# Patient Record
Sex: Female | Born: 1990 | Hispanic: Yes | Marital: Single | Smoking: Never smoker
Health system: Southern US, Community
[De-identification: ages and names within clinical notes are randomized; demographics above are authoritative.]

## PROBLEM LIST (undated history)

## (undated) DIAGNOSIS — K219 Gastro-esophageal reflux disease without esophagitis: Secondary | ICD-10-CM

## (undated) DIAGNOSIS — A64 Unspecified sexually transmitted disease: Secondary | ICD-10-CM

## (undated) DIAGNOSIS — G43909 Migraine, unspecified, not intractable, without status migrainosus: Secondary | ICD-10-CM

## (undated) DIAGNOSIS — R519 Headache, unspecified: Secondary | ICD-10-CM

## (undated) DIAGNOSIS — E785 Hyperlipidemia, unspecified: Secondary | ICD-10-CM

## (undated) DIAGNOSIS — R51 Headache: Secondary | ICD-10-CM

## (undated) HISTORY — DX: Hyperlipidemia, unspecified: E78.5

## (undated) HISTORY — DX: Headache: R51

## (undated) HISTORY — DX: Unspecified sexually transmitted disease: A64

## (undated) HISTORY — DX: Gastro-esophageal reflux disease without esophagitis: K21.9

## (undated) HISTORY — DX: Migraine, unspecified, not intractable, without status migrainosus: G43.909

## (undated) HISTORY — DX: Headache, unspecified: R51.9

## (undated) HISTORY — PX: NO PAST SURGERIES: SHX2092

---
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
## 2011-07-09 LAB — HM PAP SMEAR

## 2013-01-13 ENCOUNTER — Encounter: Payer: Self-pay | Admitting: Family Medicine

## 2013-01-13 ENCOUNTER — Ambulatory Visit (INDEPENDENT_AMBULATORY_CARE_PROVIDER_SITE_OTHER): Payer: PRIVATE HEALTH INSURANCE | Admitting: Family Medicine

## 2013-01-13 VITALS — BP 100/62 | HR 93 | Temp 98.6°F | Ht 59.0 in | Wt 93.4 lb

## 2013-01-13 DIAGNOSIS — IMO0001 Reserved for inherently not codable concepts without codable children: Secondary | ICD-10-CM

## 2013-01-13 DIAGNOSIS — Z23 Encounter for immunization: Secondary | ICD-10-CM

## 2013-01-13 DIAGNOSIS — Z0000 Encounter for general adult medical examination without abnormal findings: Secondary | ICD-10-CM

## 2013-01-13 DIAGNOSIS — Z309 Encounter for contraceptive management, unspecified: Secondary | ICD-10-CM

## 2013-01-13 NOTE — Progress Notes (Signed)
Subjective:     Tara Chang is a 22 y.o. female and is here for a comprehensive physical exam. The patient reports problems - dent in L thigh--- no accident , woke up with it one day.  History   Social History  . Marital Status: Single    Spouse Name: N/A    Number of Children: N/A  . Years of Education: N/A   Occupational History  . Not on file.   Social History Main Topics  . Smoking status: Never Smoker   . Smokeless tobacco: Never Used  . Alcohol Use: Yes     Comment: Socially  . Drug Use: No  . Sexually Active: Yes -- Female partner(s)   Other Topics Concern  . Not on file   Social History Narrative   Exercise--  2-3 days a week   No health maintenance topics applied.  The following portions of the patient's history were reviewed and updated as appropriate:  She  has a past medical history of Hyperlipemia and Migraines. She  does not have a problem list on file. She  has no past surgical history on file. Her family history includes Thyroid disease in her mother. She  reports that she has never smoked. She has never used smokeless tobacco. She reports that  drinks alcohol. She reports that she does not use illicit drugs. She currently has no medications in their medication list. No current outpatient prescriptions on file prior to visit.   No current facility-administered medications on file prior to visit.   She has No Known Allergies..  Review of Systems Review of Systems  Constitutional: Negative for activity change, appetite change and fatigue.  HENT: Negative for hearing loss, congestion, tinnitus and ear discharge.  dentist q7m Eyes: Negative for visual disturbance (see optho-- no) Respiratory: Negative for cough, chest tightness and shortness of breath.   Cardiovascular: Negative for chest pain, palpitations and leg swelling.  Gastrointestinal: Negative for abdominal pain, diarrhea, constipation and abdominal distention.  Genitourinary: Negative  for urgency, frequency, decreased urine volume and difficulty urinating.  Musculoskeletal: Negative for back pain, arthralgias and gait problem.  Skin: Negative for color change, pallor and rash.  Neurological: Negative for dizziness, light-headedness, numbness and headaches.  Hematological: Negative for adenopathy. Does not bruise/bleed easily.  Psychiatric/Behavioral: Negative for suicidal ideas, confusion, sleep disturbance, self-injury, dysphoric mood, decreased concentration and agitation.       Objective:    BP 100/62  Pulse 93  Temp(Src) 98.6 F (37 C) (Oral)  Ht 4\' 11"  (1.499 m)  Wt 93 lb 6.4 oz (42.366 kg)  BMI 18.85 kg/m2  SpO2 98% General appearance: alert, cooperative, appears stated age and no distress Head: Normocephalic, without obvious abnormality, atraumatic Eyes: conjunctivae/corneas clear. PERRL, EOM's intact. Fundi benign. Ears: normal TM's and external ear canals both ears Nose: Nares normal. Septum midline. Mucosa normal. No drainage or sinus tenderness. Throat: lips, mucosa, and tongue normal; teeth and gums normal Neck: no adenopathy, no carotid bruit, no JVD, supple, symmetrical, trachea midline and thyroid not enlarged, symmetric, no tenderness/mass/nodules Back: symmetric, no curvature. ROM normal. No CVA tenderness. Lungs: clear to auscultation bilaterally Breasts: gyn Heart: regular rate and rhythm, S1, S2 normal, no murmur, click, rub or gallop Abdomen: soft, non-tender; bowel sounds normal; no masses,  no organomegaly Pelvic: deferred--gyn Extremities: extremities normal, atraumatic, no cyanosis or edema Pulses: 2+ and symmetric Skin: Skin color, texture, turgor normal. No rashes or lesions Lymph nodes: Cervical, supraclavicular, and axillary nodes normal. Neurologic: Alert and  oriented X 3, normal strength and tone. Normal symmetric reflexes. Normal coordination and gait Psych- no anxiety, no depression      Assessment:    Healthy female  exam.      Plan:    ghm utd Check labs Refer to gyn at pt request-- for contraception.  Pt unable to take OCP and depo caused weight loss See After Visit Summary for Counseling Recommendations

## 2013-01-13 NOTE — Patient Instructions (Signed)
Preventive Care for Adults, Female A healthy lifestyle and preventive care can promote health and wellness. Preventive health guidelines for women include the following key practices.  A routine yearly physical is a good way to check with your caregiver about your health and preventive screening. It is a chance to share any concerns and updates on your health, and to receive a thorough exam.  Visit your dentist for a routine exam and preventive care every 6 months. Brush your teeth twice a day and floss once a day. Good oral hygiene prevents tooth decay and gum disease.  The frequency of eye exams is based on your age, health, family medical history, use of contact lenses, and other factors. Follow your caregiver's recommendations for frequency of eye exams.  Eat a healthy diet. Foods like vegetables, fruits, whole grains, low-fat dairy products, and lean protein foods contain the nutrients you need without too many calories. Decrease your intake of foods high in solid fats, added sugars, and salt. Eat the right amount of calories for you.Get information about a proper diet from your caregiver, if necessary.  Regular physical exercise is one of the most important things you can do for your health. Most adults should get at least 150 minutes of moderate-intensity exercise (any activity that increases your heart rate and causes you to sweat) each week. In addition, most adults need muscle-strengthening exercises on 2 or more days a week.  Maintain a healthy weight. The body mass index (BMI) is a screening tool to identify possible weight problems. It provides an estimate of body fat based on height and weight. Your caregiver can help determine your BMI, and can help you achieve or maintain a healthy weight.For adults 20 years and older:  A BMI below 18.5 is considered underweight.  A BMI of 18.5 to 24.9 is normal.  A BMI of 25 to 29.9 is considered overweight.  A BMI of 30 and above is  considered obese.  Maintain normal blood lipids and cholesterol levels by exercising and minimizing your intake of saturated fat. Eat a balanced diet with plenty of fruit and vegetables. Blood tests for lipids and cholesterol should begin at age 20 and be repeated every 5 years. If your lipid or cholesterol levels are high, you are over 50, or you are at high risk for heart disease, you may need your cholesterol levels checked more frequently.Ongoing high lipid and cholesterol levels should be treated with medicines if diet and exercise are not effective.  If you smoke, find out from your caregiver how to quit. If you do not use tobacco, do not start.  If you are pregnant, do not drink alcohol. If you are breastfeeding, be very cautious about drinking alcohol. If you are not pregnant and choose to drink alcohol, do not exceed 1 drink per day. One drink is considered to be 12 ounces (355 mL) of beer, 5 ounces (148 mL) of wine, or 1.5 ounces (44 mL) of liquor.  Avoid use of street drugs. Do not share needles with anyone. Ask for help if you need support or instructions about stopping the use of drugs.  High blood pressure causes heart disease and increases the risk of stroke. Your blood pressure should be checked at least every 1 to 2 years. Ongoing high blood pressure should be treated with medicines if weight loss and exercise are not effective.  If you are 55 to 21 years old, ask your caregiver if you should take aspirin to prevent strokes.  Diabetes   screening involves taking a blood sample to check your fasting blood sugar level. This should be done once every 3 years, after age 45, if you are within normal weight and without risk factors for diabetes. Testing should be considered at a younger age or be carried out more frequently if you are overweight and have at least 1 risk factor for diabetes.  Breast cancer screening is essential preventive care for women. You should practice "breast  self-awareness." This means understanding the normal appearance and feel of your breasts and may include breast self-examination. Any changes detected, no matter how small, should be reported to a caregiver. Women in their 20s and 30s should have a clinical breast exam (CBE) by a caregiver as part of a regular health exam every 1 to 3 years. After age 40, women should have a CBE every year. Starting at age 40, women should consider having a mammography (breast X-ray test) every year. Women who have a family history of breast cancer should talk to their caregiver about genetic screening. Women at a high risk of breast cancer should talk to their caregivers about having magnetic resonance imaging (MRI) and a mammography every year.  The Pap test is a screening test for cervical cancer. A Pap test can show cell changes on the cervix that might become cervical cancer if left untreated. A Pap test is a procedure in which cells are obtained and examined from the lower end of the uterus (cervix).  Women should have a Pap test starting at age 21.  Between ages 21 and 29, Pap tests should be repeated every 2 years.  Beginning at age 30, you should have a Pap test every 3 years as long as the past 3 Pap tests have been normal.  Some women have medical problems that increase the chance of getting cervical cancer. Talk to your caregiver about these problems. It is especially important to talk to your caregiver if a new problem develops soon after your last Pap test. In these cases, your caregiver may recommend more frequent screening and Pap tests.  The above recommendations are the same for women who have or have not gotten the vaccine for human papillomavirus (HPV).  If you had a hysterectomy for a problem that was not cancer or a condition that could lead to cancer, then you no longer need Pap tests. Even if you no longer need a Pap test, a regular exam is a good idea to make sure no other problems are  starting.  If you are between ages 65 and 70, and you have had normal Pap tests going back 10 years, you no longer need Pap tests. Even if you no longer need a Pap test, a regular exam is a good idea to make sure no other problems are starting.  If you have had past treatment for cervical cancer or a condition that could lead to cancer, you need Pap tests and screening for cancer for at least 20 years after your treatment.  If Pap tests have been discontinued, risk factors (such as a new sexual partner) need to be reassessed to determine if screening should be resumed.  The HPV test is an additional test that may be used for cervical cancer screening. The HPV test looks for the virus that can cause the cell changes on the cervix. The cells collected during the Pap test can be tested for HPV. The HPV test could be used to screen women aged 30 years and older, and should   be used in women of any age who have unclear Pap test results. After the age of 30, women should have HPV testing at the same frequency as a Pap test.  Colorectal cancer can be detected and often prevented. Most routine colorectal cancer screening begins at the age of 50 and continues through age 75. However, your caregiver may recommend screening at an earlier age if you have risk factors for colon cancer. On a yearly basis, your caregiver may provide home test kits to check for hidden blood in the stool. Use of a small camera at the end of a tube, to directly examine the colon (sigmoidoscopy or colonoscopy), can detect the earliest forms of colorectal cancer. Talk to your caregiver about this at age 50, when routine screening begins. Direct examination of the colon should be repeated every 5 to 10 years through age 75, unless early forms of pre-cancerous polyps or small growths are found.  Hepatitis C blood testing is recommended for all people born from 1945 through 1965 and any individual with known risks for hepatitis C.  Practice  safe sex. Use condoms and avoid high-risk sexual practices to reduce the spread of sexually transmitted infections (STIs). STIs include gonorrhea, chlamydia, syphilis, trichomonas, herpes, HPV, and human immunodeficiency virus (HIV). Herpes, HIV, and HPV are viral illnesses that have no cure. They can result in disability, cancer, and death. Sexually active women aged 25 and younger should be checked for chlamydia. Older women with new or multiple partners should also be tested for chlamydia. Testing for other STIs is recommended if you are sexually active and at increased risk.  Osteoporosis is a disease in which the bones lose minerals and strength with aging. This can result in serious bone fractures. The risk of osteoporosis can be identified using a bone density scan. Women ages 65 and over and women at risk for fractures or osteoporosis should discuss screening with their caregivers. Ask your caregiver whether you should take a calcium supplement or vitamin D to reduce the rate of osteoporosis.  Menopause can be associated with physical symptoms and risks. Hormone replacement therapy is available to decrease symptoms and risks. You should talk to your caregiver about whether hormone replacement therapy is right for you.  Use sunscreen with sun protection factor (SPF) of 30 or more. Apply sunscreen liberally and repeatedly throughout the day. You should seek shade when your shadow is shorter than you. Protect yourself by wearing long sleeves, pants, a wide-brimmed hat, and sunglasses year round, whenever you are outdoors.  Once a month, do a whole body skin exam, using a mirror to look at the skin on your back. Notify your caregiver of new moles, moles that have irregular borders, moles that are larger than a pencil eraser, or moles that have changed in shape or color.  Stay current with required immunizations.  Influenza. You need a dose every fall (or winter). The composition of the flu vaccine  changes each year, so being vaccinated once is not enough.  Pneumococcal polysaccharide. You need 1 to 2 doses if you smoke cigarettes or if you have certain chronic medical conditions. You need 1 dose at age 65 (or older) if you have never been vaccinated.  Tetanus, diphtheria, pertussis (Tdap, Td). Get 1 dose of Tdap vaccine if you are younger than age 65, are over 65 and have contact with an infant, are a healthcare worker, are pregnant, or simply want to be protected from whooping cough. After that, you need a Td   booster dose every 10 years. Consult your caregiver if you have not had at least 3 tetanus and diphtheria-containing shots sometime in your life or have a deep or dirty wound.  HPV. You need this vaccine if you are a woman age 26 or younger. The vaccine is given in 3 doses over 6 months.  Measles, mumps, rubella (MMR). You need at least 1 dose of MMR if you were born in 1957 or later. You may also need a second dose.  Meningococcal. If you are age 19 to 21 and a first-year college student living in a residence hall, or have one of several medical conditions, you need to get vaccinated against meningococcal disease. You may also need additional booster doses.  Zoster (shingles). If you are age 60 or older, you should get this vaccine.  Varicella (chickenpox). If you have never had chickenpox or you were vaccinated but received only 1 dose, talk to your caregiver to find out if you need this vaccine.  Hepatitis A. You need this vaccine if you have a specific risk factor for hepatitis A virus infection or you simply wish to be protected from this disease. The vaccine is usually given as 2 doses, 6 to 18 months apart.  Hepatitis B. You need this vaccine if you have a specific risk factor for hepatitis B virus infection or you simply wish to be protected from this disease. The vaccine is given in 3 doses, usually over 6 months. Preventive Services / Frequency Ages 19 to 39  Blood  pressure check.** / Every 1 to 2 years.  Lipid and cholesterol check.** / Every 5 years beginning at age 20.  Clinical breast exam.** / Every 3 years for women in their 20s and 30s.  Pap test.** / Every 2 years from ages 21 through 29. Every 3 years starting at age 30 through age 65 or 70 with a history of 3 consecutive normal Pap tests.  HPV screening.** / Every 3 years from ages 30 through ages 65 to 70 with a history of 3 consecutive normal Pap tests.  Hepatitis C blood test.** / For any individual with known risks for hepatitis C.  Skin self-exam. / Monthly.  Influenza immunization.** / Every year.  Pneumococcal polysaccharide immunization.** / 1 to 2 doses if you smoke cigarettes or if you have certain chronic medical conditions.  Tetanus, diphtheria, pertussis (Tdap, Td) immunization. / A one-time dose of Tdap vaccine. After that, you need a Td booster dose every 10 years.  HPV immunization. / 3 doses over 6 months, if you are 26 and younger.  Measles, mumps, rubella (MMR) immunization. / You need at least 1 dose of MMR if you were born in 1957 or later. You may also need a second dose.  Meningococcal immunization. / 1 dose if you are age 19 to 21 and a first-year college student living in a residence hall, or have one of several medical conditions, you need to get vaccinated against meningococcal disease. You may also need additional booster doses.  Varicella immunization.** / Consult your caregiver.  Hepatitis A immunization.** / Consult your caregiver. 2 doses, 6 to 18 months apart.  Hepatitis B immunization.** / Consult your caregiver. 3 doses usually over 6 months. Ages 40 to 64  Blood pressure check.** / Every 1 to 2 years.  Lipid and cholesterol check.** / Every 5 years beginning at age 20.  Clinical breast exam.** / Every year after age 40.  Mammogram.** / Every year beginning at age 40   and continuing for as long as you are in good health. Consult with your  caregiver.  Pap test.** / Every 3 years starting at age 30 through age 65 or 70 with a history of 3 consecutive normal Pap tests.  HPV screening.** / Every 3 years from ages 30 through ages 65 to 70 with a history of 3 consecutive normal Pap tests.  Fecal occult blood test (FOBT) of stool. / Every year beginning at age 50 and continuing until age 75. You may not need to do this test if you get a colonoscopy every 10 years.  Flexible sigmoidoscopy or colonoscopy.** / Every 5 years for a flexible sigmoidoscopy or every 10 years for a colonoscopy beginning at age 50 and continuing until age 75.  Hepatitis C blood test.** / For all people born from 1945 through 1965 and any individual with known risks for hepatitis C.  Skin self-exam. / Monthly.  Influenza immunization.** / Every year.  Pneumococcal polysaccharide immunization.** / 1 to 2 doses if you smoke cigarettes or if you have certain chronic medical conditions.  Tetanus, diphtheria, pertussis (Tdap, Td) immunization.** / A one-time dose of Tdap vaccine. After that, you need a Td booster dose every 10 years.  Measles, mumps, rubella (MMR) immunization. / You need at least 1 dose of MMR if you were born in 1957 or later. You may also need a second dose.  Varicella immunization.** / Consult your caregiver.  Meningococcal immunization.** / Consult your caregiver.  Hepatitis A immunization.** / Consult your caregiver. 2 doses, 6 to 18 months apart.  Hepatitis B immunization.** / Consult your caregiver. 3 doses, usually over 6 months. Ages 65 and over  Blood pressure check.** / Every 1 to 2 years.  Lipid and cholesterol check.** / Every 5 years beginning at age 20.  Clinical breast exam.** / Every year after age 40.  Mammogram.** / Every year beginning at age 40 and continuing for as long as you are in good health. Consult with your caregiver.  Pap test.** / Every 3 years starting at age 30 through age 65 or 70 with a 3  consecutive normal Pap tests. Testing can be stopped between 65 and 70 with 3 consecutive normal Pap tests and no abnormal Pap or HPV tests in the past 10 years.  HPV screening.** / Every 3 years from ages 30 through ages 65 or 70 with a history of 3 consecutive normal Pap tests. Testing can be stopped between 65 and 70 with 3 consecutive normal Pap tests and no abnormal Pap or HPV tests in the past 10 years.  Fecal occult blood test (FOBT) of stool. / Every year beginning at age 50 and continuing until age 75. You may not need to do this test if you get a colonoscopy every 10 years.  Flexible sigmoidoscopy or colonoscopy.** / Every 5 years for a flexible sigmoidoscopy or every 10 years for a colonoscopy beginning at age 50 and continuing until age 75.  Hepatitis C blood test.** / For all people born from 1945 through 1965 and any individual with known risks for hepatitis C.  Osteoporosis screening.** / A one-time screening for women ages 65 and over and women at risk for fractures or osteoporosis.  Skin self-exam. / Monthly.  Influenza immunization.** / Every year.  Pneumococcal polysaccharide immunization.** / 1 dose at age 65 (or older) if you have never been vaccinated.  Tetanus, diphtheria, pertussis (Tdap, Td) immunization. / A one-time dose of Tdap vaccine if you are over   65 and have contact with an infant, are a healthcare worker, or simply want to be protected from whooping cough. After that, you need a Td booster dose every 10 years.  Varicella immunization.** / Consult your caregiver.  Meningococcal immunization.** / Consult your caregiver.  Hepatitis A immunization.** / Consult your caregiver. 2 doses, 6 to 18 months apart.  Hepatitis B immunization.** / Check with your caregiver. 3 doses, usually over 6 months. ** Family history and personal history of risk and conditions may change your caregiver's recommendations. Document Released: 08/20/2001 Document Revised: 09/16/2011  Document Reviewed: 11/19/2010 [employer] Patient Information 2014 [employer]  

## 2013-01-14 ENCOUNTER — Other Ambulatory Visit: Payer: PRIVATE HEALTH INSURANCE

## 2013-01-21 ENCOUNTER — Other Ambulatory Visit (INDEPENDENT_AMBULATORY_CARE_PROVIDER_SITE_OTHER): Payer: PRIVATE HEALTH INSURANCE

## 2013-01-21 DIAGNOSIS — R7989 Other specified abnormal findings of blood chemistry: Secondary | ICD-10-CM

## 2013-01-21 DIAGNOSIS — Z0000 Encounter for general adult medical examination without abnormal findings: Secondary | ICD-10-CM

## 2013-01-21 LAB — CBC WITH DIFFERENTIAL/PLATELET
Basophils Relative: 0.3 % (ref 0.0–3.0)
Eosinophils Relative: 4.1 % (ref 0.0–5.0)
HCT: 40.5 % (ref 36.0–46.0)
Hemoglobin: 13.7 g/dL (ref 12.0–15.0)
Lymphs Abs: 2.7 10*3/uL (ref 0.7–4.0)
MCHC: 33.8 g/dL (ref 30.0–36.0)
MCV: 89.6 fl (ref 78.0–100.0)
Monocytes Absolute: 0.4 10*3/uL (ref 0.1–1.0)
Neutro Abs: 3.3 10*3/uL (ref 1.4–7.7)
RBC: 4.52 Mil/uL (ref 3.87–5.11)
WBC: 6.8 10*3/uL (ref 4.5–10.5)

## 2013-01-21 LAB — BASIC METABOLIC PANEL
CO2: 25 mEq/L (ref 19–32)
Chloride: 104 mEq/L (ref 96–112)
Potassium: 3.2 mEq/L — ABNORMAL LOW (ref 3.5–5.1)

## 2013-01-21 LAB — LIPID PANEL
Cholesterol: 205 mg/dL — ABNORMAL HIGH (ref 0–200)
HDL: 58.3 mg/dL (ref >39.00)
Total CHOL/HDL Ratio: 4
VLDL: 12 mg/dL (ref 0.0–40.0)

## 2013-01-21 LAB — HEPATIC FUNCTION PANEL
Albumin: 4 g/dL (ref 3.5–5.2)
Bilirubin, Direct: 0 mg/dL (ref 0.0–0.3)
Total Protein: 7.1 g/dL (ref 6.0–8.3)

## 2013-01-21 LAB — TSH: TSH: 2.07 u[IU]/mL (ref 0.35–5.50)

## 2013-01-21 LAB — LDL CHOLESTEROL, DIRECT: Direct LDL: 136.3 mg/dL

## 2013-01-22 ENCOUNTER — Ambulatory Visit (INDEPENDENT_AMBULATORY_CARE_PROVIDER_SITE_OTHER): Payer: PRIVATE HEALTH INSURANCE | Admitting: Obstetrics and Gynecology

## 2013-01-22 ENCOUNTER — Encounter: Payer: Self-pay | Admitting: Obstetrics and Gynecology

## 2013-01-22 VITALS — BP 98/60 | HR 80 | Ht 59.25 in | Wt 93.5 lb

## 2013-01-22 DIAGNOSIS — Z113 Encounter for screening for infections with a predominantly sexual mode of transmission: Secondary | ICD-10-CM

## 2013-01-22 DIAGNOSIS — N912 Amenorrhea, unspecified: Secondary | ICD-10-CM

## 2013-01-22 DIAGNOSIS — Z0000 Encounter for general adult medical examination without abnormal findings: Secondary | ICD-10-CM

## 2013-01-22 DIAGNOSIS — Z01419 Encounter for gynecological examination (general) (routine) without abnormal findings: Secondary | ICD-10-CM

## 2013-01-22 LAB — POCT URINALYSIS DIPSTICK
Bilirubin, UA: NEGATIVE
Blood, UA: NEGATIVE
Glucose, UA: NEGATIVE
Ketones, UA: NEGATIVE
Nitrite, UA: NEGATIVE

## 2013-01-22 LAB — STD PANEL: Hepatitis B Surface Ag: NEGATIVE

## 2013-01-22 LAB — HEPATITIS C ANTIBODY: HCV Ab: NEGATIVE

## 2013-01-22 MED ORDER — ETONOGESTREL-ETHINYL ESTRADIOL 0.12-0.015 MG/24HR VA RING: VAGINAL_RING | VAGINAL | Status: DC

## 2013-01-22 MED ORDER — MEDROXYPROGESTERONE ACETATE 10 MG PO TABS: 10.0000 mg | ORAL_TABLET | Freq: Every day | ORAL | Status: DC

## 2013-01-22 MED ORDER — MEDROXYPROGESTERONE ACETATE 5 MG PO TABS: 10.0000 mg | ORAL_TABLET | Freq: Every day | ORAL | Status: DC

## 2013-01-22 NOTE — Patient Instructions (Addendum)
EXERCISE AND DIET:  We recommended that you start or continue a regular exercise program for good health. Regular exercise means any activity that makes your heart beat faster and makes you sweat.  We recommend exercising at least 30 minutes per day at least 3 days a week, preferably 4 or 5.  We also recommend a diet low in fat and sugar.  Inactivity, poor dietary choices and obesity can cause diabetes, heart attack, stroke, and kidney damage, among others.    ALCOHOL AND SMOKING:  Women should limit their alcohol intake to no more than 7 drinks/beers/glasses of wine (combined, not each!) per week. Moderation of alcohol intake to this level decreases your risk of breast cancer and liver damage. And of course, no recreational drugs are part of a healthy lifestyle.  And absolutely no smoking or even second hand smoke. Most people know smoking can cause heart and lung diseases, but did you know it also contributes to weakening of your bones? Aging of your skin?  Yellowing of your teeth and nails?  CALCIUM AND VITAMIN D:  Adequate intake of calcium and Vitamin D are recommended.  The recommendations for exact amounts of these supplements seem to change often, but generally speaking 600 mg of calcium (either carbonate or citrate) and 800 units of Vitamin D per day seems prudent. Certain women may benefit from higher intake of Vitamin D.  If you are among these women, your doctor will have told you during your visit.    PAP SMEARS:  Pap smears, to check for cervical cancer or precancers,  have traditionally been done yearly, although recent scientific advances have shown that most women can have pap smears less often.  However, every woman still should have a physical exam from her gynecologist every year. It will include a breast check, inspection of the vulva and vagina to check for abnormal growths or skin changes, a visual exam of the cervix, and then an exam to evaluate the size and shape of the uterus and  ovaries.  And after 21 years of age, a rectal exam is indicated to check for rectal cancers. We will also provide age appropriate advice regarding health maintenance, like when you should have certain vaccines, screening for sexually transmitted diseases, bone density testing, colonoscopy, mammograms, etc.   MAMMOGRAMS:  All women over 40 years old should have a yearly mammogram. Many facilities now offer a "3D" mammogram, which may cost around $50 extra out of pocket. If possible,  we recommend you accept the option to have the 3D mammogram performed.  It both reduces the number of women who will be called back for extra views which then turn out to be normal, and it is better than the routine mammogram at detecting truly abnormal areas.    COLONOSCOPY:  Colonoscopy to screen for colon cancer is recommended for all women at age 50.  We know, you hate the idea of the prep.  We agree, BUT, having colon cancer and not knowing it is worse!!  Colon cancer so often starts as a polyp that can be seen and removed at colonscopy, which can quite literally save your life!  And if your first colonoscopy is normal and you have no family history of colon cancer, most women don't have to have it again for 10 years.  Once every ten years, you can do something that may end up saving your life, right?  We will be happy to help you get it scheduled when you are ready.    Be sure to check your insurance coverage so you understand how much it will cost.  It may be covered as a preventative service at no cost, but you should check your particular policy.    Ethinyl Estradiol; Etonogestrel vaginal ring What is this medicine? ETHINYL ESTRADIOL; ETONOGESTREL (ETH in il es tra DYE ole; et oh noe JES trel) vaginal ring is a flexible, vaginal ring used as a contraceptive (birth control method). This medicine combines two types of female hormones, an estrogen and a progestin. This ring is used to prevent ovulation and pregnancy. Each  ring is effective for one month. This medicine may be used for other purposes; ask your health care provider or pharmacist if you have questions. What should I tell my health care provider before I take this medicine? They need to know if you have or ever had any of these conditions: -abnormal vaginal bleeding -blood vessel disease or blood clots -breast, cervical, endometrial, ovarian, liver, or uterine cancer -diabetes -gallbladder disease -heart disease or recent heart attack -high blood pressure -high cholesterol -kidney disease -liver disease -migraine headaches -stroke -systemic lupus erythematosus (SLE) -tobacco smoker -an unusual or allergic reaction to estrogens, progestins, other medicines, foods, dyes, or preservatives -pregnant or trying to get pregnant -breast-feeding How should I use this medicine? Insert the ring into your vagina as directed. Follow the directions on the prescription label. The ring will remain place for 3 weeks and is then removed for a 1-week break. A new ring is inserted 1 week after the last ring was removed, on the same day of the week. Do not use more often than directed. A patient package insert for the product will be given with each prescription and refill. Read this sheet carefully each time. The sheet may change frequently. Contact your pediatrician regarding the use of this medicine in children. Special care may be needed. This medicine has been used in female children who have started having menstrual periods. Overdosage: If you think you have taken too much of this medicine contact a poison control center or emergency room at once. NOTE: This medicine is only for you. Do not share this medicine with others. What if I miss a dose? You will need to replace your vaginal ring once a month as directed. If the ring should slip out, or if you leave it in longer or shorter than you should, contact your health care professional for advice. What may  interact with this medicine? -acetaminophen -antibiotics or medicines for infections, especially rifampin, rifabutin, rifapentine, and griseofulvin, and possibly penicillins or tetracyclines -aprepitant -ascorbic acid (vitamin C) -atorvastatin -barbiturate medicines, such as phenobarbital -bosentan -carbamazepine -caffeine -clofibrate -cyclosporine -dantrolene -doxercalciferol -felbamate -grapefruit juice -hydrocortisone -medicines for anxiety or sleeping problems, such as diazepam or temazepam -medicines for diabetes, including pioglitazone -modafinil -mycophenolate -nefazodone -oxcarbazepine -phenytoin -prednisolone -ritonavir or other medicines for HIV infection or AIDS -rosuvastatin -selegiline -soy isoflavones supplements -St. John's wort -tamoxifen or raloxifene -theophylline -thyroid hormones -topiramate -warfarin This list may not describe all possible interactions. Give your health care provider a list of all the medicines, herbs, non-prescription drugs, or dietary supplements you use. Also tell them if you smoke, drink alcohol, or use illegal drugs. Some items may interact with your medicine. What should I watch for while using this medicine? Visit your doctor or health care professional for regular checks on your progress. You will need a regular breast and pelvic exam and Pap smear while on this medicine. Use an additional method of contraception during  the first cycle that you use this ring. If you have any reason to think you are pregnant, stop using this medicine right away and contact your doctor or health care professional. If you are using this medicine for hormone related problems, it may take several cycles of use to see improvement in your condition. Smoking increases the risk of getting a blood clot or having a stroke while you are using hormonal birth control, especially if you are more than 21 years old. You are strongly advised not to smoke. This  medicine can make your body retain fluid, making your fingers, hands, or ankles swell. Your blood pressure can go up. Contact your doctor or health care professional if you feel you are retaining fluid. This medicine can make you more sensitive to the sun. Keep out of the sun. If you cannot avoid being in the sun, wear protective clothing and use sunscreen. Do not use sun lamps or tanning beds/booths. If you wear contact lenses and notice visual changes, or if the lenses begin to feel uncomfortable, consult your eye care specialist. In some women, tenderness, swelling, or minor bleeding of the gums may occur. Notify your dentist if this happens. Brushing and flossing your teeth regularly may help limit this. See your dentist regularly and inform your dentist of the medicines you are taking. If you are going to have elective surgery, you may need to stop using this medicine before the surgery. Consult your health care professional for advice. This medicine does not protect you against HIV infection (AIDS) or any other sexually transmitted diseases. What side effects may I notice from receiving this medicine? Side effects that you should report to your doctor or health care professional as soon as possible: -breast tissue changes or discharge -changes in vaginal bleeding during your period or between your periods -chest pain -coughing up blood -dizziness or fainting spells -headaches or migraines -leg, arm or groin pain -severe or sudden headaches -stomach pain (severe) -sudden shortness of breath -sudden loss of coordination, especially on one side of the body -speech problems -symptoms of vaginal infection like itching, irritation or unusual discharge -tenderness in the upper abdomen -vomiting -weakness or numbness in the arms or legs, especially on one side of the body -yellowing of the eyes or skin Side effects that usually do not require medical attention (report to your doctor or health  care professional if they continue or are bothersome): -breakthrough bleeding and spotting that continues beyond the 3 initial cycles of pills -breast tenderness -mood changes, anxiety, depression, frustration, anger, or emotional outbursts -increased sensitivity to sun or ultraviolet light -nausea -skin rash, acne, or brown spots on the skin -weight gain (slight) This list may not describe all possible side effects. Call your doctor for medical advice about side effects. You may report side effects to FDA at 1-800-FDA-1088. Where should I keep my medicine? Keep out of the reach of children. Store at room temperature between 15 and 30 degrees C (59 and 86 degrees F) for up to 4 months. The product will expire after 4 months. Protect from light. Throw away any unused medicine after the expiration date. NOTE: This sheet is a summary. It may not cover all possible information. If you have questions about this medicine, talk to your doctor, pharmacist, or health care provider.  2013, [employer]. (06/09/2008 12:03:58 PM) Secondary Amenorrhea  Secondary amenorrhea is the stopping of menstrual flow for 3 to 6 months in a female who has previously had periods. There  are many possible causes. Most of these causes are not serious. Usually treating the underlying problem causing the loss of menses will return your periods to normal. CAUSES  Some common and uncommon causes of not menstruating include:  Malnutrition.  Low blood sugar (hypoglycemia).  Polycystic ovarian disease.  Stress or fear.  Breastfeeding.  Hormone imbalance.  Ovarian failure.  Medications.  Extreme obesity.  Cystic fibrosis.  Low body weight or drastic weight reduction from any cause.  Early menopause.  Removal of ovaries or uterus.  Contraceptives.  Illness.  Long term (chronic) illnesses.  Cushing's syndrome.  Thyroid problems.  Birth control pills, patches, or vaginal rings for birth  control. DIAGNOSIS  This diagnosis is made by your caregiver taking a medical history and doing a physical exam. Pregnancy must be ruled out. Often times, numerous blood tests of different hormones in the body may be measured. Urine testing may be done. Specialized x-rays may have to be done as well as measuring the body mass index (BMI). TREATMENT  Treatment depends on the cause of the amenorrhea. If an eating disorder is present, this can be treated with an adequate diet and therapy. Chronic illnesses may improve with treatment of the illness. Overall, the outlook is good. The amenorrhea may be corrected with medications, lifestyle changes, or surgery. If the amenorrhea cannot be corrected, it is sometimes possible to create a false menstruation with medications. Document Released: 08/05/2006 Document Revised: 09/16/2011 Document Reviewed: 06/12/2007 [facility] Patient Information 2014 [place], [place].

## 2013-01-22 NOTE — Progress Notes (Signed)
Patient ID: Tara Chang, female   DOB: [birth year], 22 y.o.   MRN: [id number] 22 y.o.   Single    Hispanic   female   No obstetric history on file.   here for annual exam.   Depo Provera given in November 2013.  Had no bleeding since except spotting last week. Did not like Depo due to weight loss - 5 pounds in total.   No headaches, visual changes or loss, hair growth, nipple discharge.   No irregular menses prior to Depo Provera.  Menarche age 6 years old.   Wants STD testing today.  No LMP recorded. Patient has had an injection.          Sexually active: yes  The current method of family planning is condoms sometimes.    Exercising: cardio and weights Last mammogram:  never Last pap smear: never History of abnormal pap: n/a Smoking: no Alcohol: rarely Last colonoscopy: never Last Bone Density:  never Last tetanus shot: due for injection Gardasil - doing with PCP.  Has had first injection. Last cholesterol check: 07/2012: elevated and trying to control with diet  Hgb:    PCP            Urine: Neg  UPT:  Neg   Family History  Problem Relation Age of Onset  . Thyroid disease Mother   . Hypertension Maternal Grandmother   . Migraines Maternal Grandfather     There are no active problems to display for this patient.   Past Medical History  Diagnosis Date  . Hyperlipemia   . Migraines     History reviewed. No pertinent past surgical history.  Allergies: Review of patient's allergies indicates no known allergies.  No current outpatient prescriptions on file.   No current facility-administered medications for this visit.    ROS: Pertinent items are noted in HPI.  Social Hx:  Works for a [employer]  Exam:    BP 98/60  Pulse 80  Ht 4' 11.25" (1.505 m)  Wt 93 lb 8 oz (42.411 kg)  BMI 18.72 kg/m2   Wt Readings from Last 3 Encounters:  01/22/13 93 lb 8 oz (42.411 kg)  01/13/13 93 lb 6.4 oz (42.366 kg)     Ht Readings from Last 3  Encounters:  01/22/13 4' 11.25" (1.505 m)  01/13/13 4\' 11"  (1.499 m)    General appearance: alert, cooperative and appears stated age Head: Normocephalic, without obvious abnormality, atraumatic Neck: no adenopathy, supple, symmetrical, trachea midline and thyroid not enlarged, symmetric, no tenderness/mass/nodules Lungs: clear to auscultation bilaterally Breasts: Inspection negative, No nipple retraction or dimpling, No nipple discharge or bleeding, No axillary or supraclavicular adenopathy, Normal to palpation without dominant masses Heart: regular rate and rhythm Abdomen: soft, non-tender; no masses,  no organomegaly Extremities: extremities normal, atraumatic, no cyanosis or edema Skin: Skin color, texture, turgor normal. No rashes or lesions Lymph nodes: Cervical, supraclavicular, and axillary nodes normal. No abnormal inguinal nodes palpated Neurologic: Grossly normal   Pelvic: External genitalia:  no lesions              Urethra:  normal appearing urethra with no masses, tenderness or lesions              Bartholins and Skenes: normal                 Vagina: normal appearing vagina with normal color and discharge, no lesions  Cervix: normal appearance              Pap taken: no        Bimanual Exam:  Uterus:  uterus is normal size, shape, consistency and nontender                                      Adnexa: normal adnexa in size, nontender and no masses                                         Assessment Normal gynecologic exam Amenorrhea.  Negative UPT.  I suspect an effect of Depo Provera and hypothalamic component due to weight loss. Need for STD testing In process of doing Gardasil series with PCP     Plan Provera challenge.  See Epic orders. Check prolactin, estradiol, LH, FSH GC/CT, HIV, RPR, Hep C, Hep B Safe sex discussed. Sample of Nuva Ring - lot [id number], Expiration 10/16.  Rx for 12 months in Epic.  Patient instructed in use.  Side effects and  warning signs of stroke, PE, MI, DVT discussed. Complete Gardasil with PCP. Follow up visit in 3 months. return annually or prn     An After Visit Summary was printed and given to the patient.

## 2013-01-23 LAB — PROLACTIN: Prolactin: 9.9 ng/mL

## 2013-01-23 LAB — GC/CHLAMYDIA PROBE AMP, URINE: Chlamydia, Swab/Urine, PCR: NEGATIVE

## 2013-01-25 LAB — POCT URINALYSIS DIPSTICK
Blood, UA: NEGATIVE
Ketones, UA: NEGATIVE
Protein, UA: NEGATIVE
Spec Grav, UA: >=1.03
pH, UA: 6

## 2013-01-27 ENCOUNTER — Telehealth: Payer: Self-pay

## 2013-01-27 NOTE — Telephone Encounter (Signed)
LMOVM to call for test results. 

## 2013-01-27 NOTE — Telephone Encounter (Signed)
Message copied by Alphonsa Overall on Wed Jan 27, 2013  2:38 PM ------      Message from: Conley Simmonds      Created: Mon Jan 25, 2013  9:45 AM       Please inform of negative and normal test results.      I believe that the patient has not return to normal menstrual cycles due to her Depo Provera injection in the fall and then weight loss.            She was instructed to take Provera to initiate a menstrual cycle and then begin her prescription for contraception. ------

## 2013-01-28 NOTE — Telephone Encounter (Signed)
Patient notified of normal labs.  Advised to take Provera to initiate menstrual cycle and then begin RX for contraception.

## 2013-01-28 NOTE — Telephone Encounter (Signed)
Patient called back to speak with you regarding your call .

## 2013-03-17 ENCOUNTER — Ambulatory Visit (INDEPENDENT_AMBULATORY_CARE_PROVIDER_SITE_OTHER): Payer: PRIVATE HEALTH INSURANCE

## 2013-03-17 DIAGNOSIS — Z23 Encounter for immunization: Secondary | ICD-10-CM

## 2013-07-16 ENCOUNTER — Ambulatory Visit: Payer: PRIVATE HEALTH INSURANCE

## 2013-07-19 ENCOUNTER — Ambulatory Visit (INDEPENDENT_AMBULATORY_CARE_PROVIDER_SITE_OTHER): Payer: PRIVATE HEALTH INSURANCE | Admitting: *Deleted

## 2013-07-19 ENCOUNTER — Ambulatory Visit: Payer: PRIVATE HEALTH INSURANCE

## 2013-07-19 DIAGNOSIS — Z23 Encounter for immunization: Secondary | ICD-10-CM

## 2014-01-26 ENCOUNTER — Ambulatory Visit: Payer: PRIVATE HEALTH INSURANCE | Admitting: Obstetrics and Gynecology

## 2014-03-02 ENCOUNTER — Encounter: Payer: Self-pay | Admitting: Obstetrics and Gynecology

## 2014-03-02 ENCOUNTER — Ambulatory Visit (INDEPENDENT_AMBULATORY_CARE_PROVIDER_SITE_OTHER): Payer: PRIVATE HEALTH INSURANCE | Admitting: Obstetrics and Gynecology

## 2014-03-02 VITALS — BP 90/70 | HR 80 | Resp 18 | Ht 60.0 in | Wt 102.2 lb

## 2014-03-02 DIAGNOSIS — D649 Anemia, unspecified: Secondary | ICD-10-CM

## 2014-03-02 DIAGNOSIS — Z01419 Encounter for gynecological examination (general) (routine) without abnormal findings: Secondary | ICD-10-CM

## 2014-03-02 DIAGNOSIS — Z0000 Encounter for general adult medical examination without abnormal findings: Secondary | ICD-10-CM

## 2014-03-02 DIAGNOSIS — Z113 Encounter for screening for infections with a predominantly sexual mode of transmission: Secondary | ICD-10-CM

## 2014-03-02 DIAGNOSIS — Z308 Encounter for other contraceptive management: Secondary | ICD-10-CM

## 2014-03-02 LAB — STD PANEL
HIV: NONREACTIVE
Hepatitis B Surface Ag: NEGATIVE

## 2014-03-02 LAB — COMPREHENSIVE METABOLIC PANEL
ALBUMIN: 4.1 g/dL (ref 3.5–5.2)
ALK PHOS: 72 U/L (ref 39–117)
ALT: 72 U/L — ABNORMAL HIGH (ref 0–35)
AST: 38 U/L — AB (ref 0–37)
BUN: 13 mg/dL (ref 6–23)
CALCIUM: 8.9 mg/dL (ref 8.4–10.5)
CHLORIDE: 103 meq/L (ref 96–112)
CO2: 25 mEq/L (ref 19–32)
Creat: 0.65 mg/dL (ref 0.50–1.10)
GLUCOSE: 82 mg/dL (ref 70–99)
POTASSIUM: 4.2 meq/L (ref 3.5–5.3)
SODIUM: 137 meq/L (ref 135–145)
TOTAL PROTEIN: 7.2 g/dL (ref 6.0–8.3)
Total Bilirubin: 0.6 mg/dL (ref 0.2–1.2)

## 2014-03-02 LAB — POCT URINALYSIS DIPSTICK
BILIRUBIN UA: NEGATIVE
Glucose, UA: NEGATIVE
Ketones, UA: NEGATIVE
LEUKOCYTES UA: NEGATIVE
Nitrite, UA: NEGATIVE
Protein, UA: NEGATIVE
RBC UA: NEGATIVE
Urobilinogen, UA: NEGATIVE
pH, UA: 5

## 2014-03-02 LAB — CBC
HCT: 39.7 % (ref 36.0–46.0)
Hemoglobin: 13.6 g/dL (ref 12.0–15.0)
MCH: 29.5 pg (ref 26.0–34.0)
MCHC: 34.3 g/dL (ref 30.0–36.0)
MCV: 86.1 fL (ref 78.0–100.0)
PLATELETS: 347 10*3/uL (ref 150–400)
RBC: 4.61 MIL/uL (ref 3.87–5.11)
RDW: 13.3 % (ref 11.5–15.5)
WBC: 5.8 10*3/uL (ref 4.0–10.5)

## 2014-03-02 LAB — HEMOGLOBIN, FINGERSTICK: Hemoglobin, fingerstick: 11.7 g/dL — ABNORMAL LOW (ref 12.0–16.0)

## 2014-03-02 LAB — LIPID PANEL
CHOL/HDL RATIO: 3.2 ratio
Cholesterol: 191 mg/dL (ref 0–200)
HDL: 60 mg/dL (ref >39)
LDL Cholesterol: 116 mg/dL — ABNORMAL HIGH (ref 0–99)
Triglycerides: 73 mg/dL (ref <150)
VLDL: 15 mg/dL (ref 0–40)

## 2014-03-02 LAB — HEPATITIS C ANTIBODY: HCV Ab: NEGATIVE

## 2014-03-02 LAB — IRON: IRON: 76 ug/dL (ref 42–145)

## 2014-03-02 NOTE — Patient Instructions (Signed)

## 2014-03-02 NOTE — Progress Notes (Signed)
Patient ID: Tara Chang, female   DOB: [birth year], 23 y.o.   MRN: [id number] GYNECOLOGY VISIT  PCP:   Loreen Freud, MD  Referring provider:   HPI: 23 y.o.   Single  Caucasian  female   No obstetric history on file. with Patient's last menstrual period was 02/19/2014.   here for   AEX. Stopped birth control when she was not sexually active.  Now active again and wants to consider an IUD. Did not like Depo Provera because she lost weight when she was on it.   Patient and partner of 5 years took a break and are now reuniting.   Wants blood work.  Elevated cholesterol last year.  Wants STD testing.   Hgb:    11.7.  Not eating much red meat.  Got braces on teeth one month ago.  Urine: Neg  GYNECOLOGIC HISTORY: Patient's last menstrual period was 02/19/2014. Sexually active:  yes Partner preference: female Contraception:  None.   Menopausal hormone therapy: n/a DES exposure:   no Blood transfusions:   no Sexually transmitted diseases:   no GYN procedures and prior surgeries:  no Last mammogram:  n/a               Last pap and high risk HPV testing: never   History of abnormal pap smear:  no   OB History   Grav Para Term Preterm Abortions TAB SAB Ect Mult Living   0                LIFESTYLE: Exercise:  Free weights              OTHER HEALTH MAINTENANCE: Tetanus/TDap:  03-17-13 HPV:                  Completed 2015 Influenza:          never   Bone density:   n/a Colonoscopy:   n/a  Cholesterol check: 01-21-13 Borderline  Family History  Problem Relation Age of Onset  . Thyroid disease Mother   . Hypertension Maternal Grandmother   . Migraines Maternal Grandfather     There are no active problems to display for this patient.  Past Medical History  Diagnosis Date  . Hyperlipemia   . Migraines     History reviewed. No pertinent past surgical history.  ALLERGIES: Review of patient's allergies indicates no known allergies.  No current outpatient  prescriptions on file.   No current facility-administered medications for this visit.     ROS:  Pertinent items are noted in HPI.  History   Social History  . Marital Status: Single    Spouse Name: N/A    Number of Children: N/A  . Years of Education: N/A   Occupational History  . Not on file.   Social History Main Topics  . Smoking status: Never Smoker   . Smokeless tobacco: Never Used  . Alcohol Use: 1.0 oz/week    2 drink(s) per week     Comment: Socially  . Drug Use: No  . Sexual Activity: Yes    Partners: Male    Birth Control/ Protection: None   Other Topics Concern  . Not on file   Social History Narrative   Exercise--  2-3 days a week    PHYSICAL EXAMINATION:    BP 90/70  Pulse 80  Resp 18  Ht 5' (1.524 m)  Wt 102 lb 3.2 oz (46.358 kg)  BMI 19.96 kg/m2  LMP 02/19/2014   Wt  Readings from Last 3 Encounters:  03/02/14 102 lb 3.2 oz (46.358 kg)  01/22/13 93 lb 8 oz (42.411 kg)  01/13/13 93 lb 6.4 oz (42.366 kg)     Ht Readings from Last 3 Encounters:  03/02/14 5' (1.524 m)  01/22/13 4' 11.25" (1.505 m)  01/13/13  (1.499 m)    General appearance: alert, cooperative and appears stated age Head: Normocephalic, without obvious abnormality, atraumatic Neck: no adenopathy, supple, symmetrical, trachea midline and thyroid not enlarged, symmetric, no tenderness/mass/nodules Lungs: clear to auscultation bilaterally Breasts: Inspection negative, No nipple retraction or dimpling, No nipple discharge or bleeding, No axillary or supraclavicular adenopathy, Normal to palpation without dominant masses Heart: regular rate and rhythm Abdomen: soft, non-tender; no masses,  no organomegaly Extremities: extremities normal, atraumatic, no cyanosis or edema Skin: Skin color, texture, turgor normal. No rashes or lesions Lymph nodes: Cervical, supraclavicular, and axillary nodes normal. No abnormal inguinal nodes palpated Neurologic: Grossly normal  Pelvic:  External genitalia:  no lesions              Urethra:  normal appearing urethra with no masses, tenderness or lesions              Bartholins and Skenes: normal                 Vagina: normal appearing vagina with normal color and discharge, no lesions              Cervix: normal appearance              Pap and high risk HPV reflex testing done: Yes.          Bimanual Exam:  Uterus:  uterus is normal size, shape, consistency and nontender                                      Adnexa: normal adnexa in size, nontender and no masses                                      Rectovaginal:  No.                                   ASSESSMENT  Normal gynecologic exam. Desire for long acting contraception.  Desire for STD testing.  Elevated cholesterol last year with PCP.  Mild anemia today.  I suspect iron deficiency.   PLAN  Mammogram recommended yearly starting at age 62. Pap smear and high risk HPV testing as above. Counseled on self breast exam, Calcium and vitamin D intake, exercise. See lab orders: Yes.   Discussion about risks and benefits of Depo Provera, Skyla, ParaGard, and Nexplanon.  Chooses ParaGard. Will precert ParaGard IUD. Return annually or prn   An After Visit Summary was printed and given to the patient.

## 2014-03-04 LAB — IPS N GONORRHOEA AND CHLAMYDIA BY PCR

## 2014-03-04 LAB — IPS PAP TEST WITH REFLEX TO HPV

## 2014-03-09 ENCOUNTER — Encounter: Payer: Self-pay | Admitting: Obstetrics and Gynecology

## 2014-03-10 ENCOUNTER — Telehealth: Payer: Self-pay

## 2014-03-10 NOTE — Telephone Encounter (Signed)
Called pt with lab results, LMOVM at [phone].

## 2014-03-10 NOTE — Telephone Encounter (Signed)
Message copied by Alphonsa Overall on Thu Mar 10, 2014 10:09 AM ------      Message from: AMUNDSON DE Gwenevere Ghazi, BROOK E      Created: Mon Mar 07, 2014  9:24 PM       Please report negative GC/CT results to patient. ------

## 2014-03-10 NOTE — Telephone Encounter (Signed)
Spoke with patient advised of results as seen below from Dr.Silva. Patient is agreeable and will schedule appointment with Dr.Lowne who is her PCP. Patient will take lab work with her to appointment. Patient has access to labs on mychart.  Notes Recorded by Jacqualin Combes de Gwenevere Ghazi, MD on 03/07/2014 at 9:24 PM Please report negative GC/CT results to patient. Notes Recorded by Jacqualin Combes de Gwenevere Ghazi, MD on 03/04/2014 at 11:10 AM Pap negative.  Recall 02. Notes Recorded by Jacqualin Combes de Gwenevere Ghazi, MD on 03/03/2014 at 1:14 PM Please report results to patient.  Her STD testing of serum was negative. GC/CT are not back yet.   The rest of her blood work showed mildly elevated LDL cholesterol, but the cholesterol ratios were good.   Her liver function is slightly elevated, and I would like to have her follow up with her primary care physician.  Hepatitis B and C testing was negative.   Routing to provider for final review. Patient agreeable to disposition. Will close encounter

## 2014-03-25 ENCOUNTER — Telehealth: Payer: Self-pay | Admitting: Emergency Medicine

## 2014-03-25 NOTE — Telephone Encounter (Signed)
/  Patient is G0P0 and would like pargard   Patient will need cytotec.   Spoke with patient. She started her menstrual cycle on 03/21/14.   Advised patient will need cytotec and to have pargard placed in first 7 days of cycle.   Instructed pt to call back on the first day of her next cycle to be scheduled for insertion. If period starts on weekend, just call Monday morning. Pt agreeable.  Routing to provider for final review. Patient agreeable to disposition. Will close encounter

## 2014-03-25 NOTE — Telephone Encounter (Deleted)
To: Tara Chang   From: [employer] de Gwenevere Ghazi, MD   Created: 03/24/2014 1:13 PM    I will ask the triage nurse to contact you regarding an appointment for your ParaGard IUD insertion.   Thanks,  Conley Simmonds, MD  ----- Message ----- From: Tara Chang Sent: 03/24/2014 11:42 AM EDT To: Annamaria Helling, MD Subject: RE: Visit Follow-Up Question  Hello Tara Chang,  I'm just now seeing this message and that's great news! I actually just finished my monthly cycle it was only three days long. Please let me know when I can go to get an appointment. Thank you!  ----- Message ----- From: Tara Chang Sent: 03/10/2014 9:13 AM EDT To: Tara Chang Subject: RE: Visit Follow-Up Question  Good morning [name],  I am Sabrina, the insurance/referral coordinator here in the office. I spoke with [employer] regarding your birth control benefits. The service will be covered for you at 100% of the allowable amount. You will not have any out of pocket responsibility for this particular service. You will need to contact our office within the first 5 days of your cycle to schedule the insertion.  Feel free to reach out to [place] if you have any further questions/concerns.  Thank you, Cathrine Muster   ----- Message ----- From: Tara Chang Sent: 03/09/2014 11:21 AM EDT To: Annamaria Helling, MD Subject: Visit Follow-Up Question  Hello Dr. Edward Jolly  I would just like to know if we heard back from my insurance about the birth control i had requested. I would also like to ask about my iron i know that its low and would like to know what i can do to help it. Please let me know. Thank you!

## 2014-03-25 NOTE — Telephone Encounter (Signed)
From [place] E [place] de Gwenevere Ghazi, MD [[id number]] To Alexia G Cruz-Guerra Composed 03/24/2014 1:13 PM For Delivery On 03/24/2014 1:13 PM Subject RE: Visit Follow-Up Question Message Type Patient Medical Advice Request Read Status Y By Rebekah Chesterfield - last viewed at 1:34 PM on 03/24/2014 Message Body I will ask the triage nurse to contact you regarding an appointment for your ParaGard IUD insertion.   Thanks,   Conley Simmonds, MD   ----- Message -----  From: Rebekah Chesterfield  Sent: 03/24/2014 11:42 AM EDT  To: Annamaria Helling, MD  Subject: RE: Visit Follow-Up Question   Hello Martie Lee,   I'm just now seeing this message and that's great news! I actually just finished my monthly cycle it was only three days long. Please let me know when I can go to get an appointment. Thank you!

## 2014-05-25 ENCOUNTER — Telehealth: Payer: Self-pay | Admitting: Obstetrics and Gynecology

## 2014-05-25 NOTE — Telephone Encounter (Signed)
Left message upcoming appointment 02/2015 has been canceled and needs to be rescheduled.

## 2015-02-01 ENCOUNTER — Encounter: Payer: Self-pay | Admitting: Obstetrics and Gynecology

## 2015-02-01 ENCOUNTER — Ambulatory Visit (INDEPENDENT_AMBULATORY_CARE_PROVIDER_SITE_OTHER): Payer: Commercial Managed Care - PPO | Admitting: Obstetrics and Gynecology

## 2015-02-01 VITALS — BP 100/62 | HR 80 | Resp 20 | Ht 59.25 in | Wt 110.0 lb

## 2015-02-01 DIAGNOSIS — R7989 Other specified abnormal findings of blood chemistry: Secondary | ICD-10-CM | POA: Diagnosis not present

## 2015-02-01 DIAGNOSIS — R8290 Unspecified abnormal findings in urine: Secondary | ICD-10-CM

## 2015-02-01 DIAGNOSIS — Z113 Encounter for screening for infections with a predominantly sexual mode of transmission: Secondary | ICD-10-CM | POA: Diagnosis not present

## 2015-02-01 DIAGNOSIS — Z0000 Encounter for general adult medical examination without abnormal findings: Secondary | ICD-10-CM

## 2015-02-01 DIAGNOSIS — N898 Other specified noninflammatory disorders of vagina: Secondary | ICD-10-CM

## 2015-02-01 DIAGNOSIS — R5383 Other fatigue: Secondary | ICD-10-CM | POA: Diagnosis not present

## 2015-02-01 DIAGNOSIS — N9489 Other specified conditions associated with female genital organs and menstrual cycle: Secondary | ICD-10-CM | POA: Diagnosis not present

## 2015-02-01 DIAGNOSIS — Z01419 Encounter for gynecological examination (general) (routine) without abnormal findings: Secondary | ICD-10-CM | POA: Diagnosis not present

## 2015-02-01 LAB — POCT URINALYSIS DIPSTICK
Bilirubin, UA: NEGATIVE
Blood, UA: NEGATIVE
GLUCOSE UA: NEGATIVE
Ketones, UA: NEGATIVE
LEUKOCYTES UA: NEGATIVE
Nitrite, UA: POSITIVE
Protein, UA: NEGATIVE
Urobilinogen, UA: NEGATIVE
pH, UA: 5

## 2015-02-01 LAB — CBC
HCT: 38.4 % (ref 36.0–46.0)
Hemoglobin: 12.7 g/dL (ref 12.0–15.0)
MCH: 29.2 pg (ref 26.0–34.0)
MCHC: 33.1 g/dL (ref 30.0–36.0)
MCV: 88.3 fL (ref 78.0–100.0)
MPV: 9.5 fL (ref 8.6–12.4)
Platelets: 329 K/uL (ref 150–400)
RBC: 4.35 MIL/uL (ref 3.87–5.11)
RDW: 13 % (ref 11.5–15.5)
WBC: 6.8 K/uL (ref 4.0–10.5)

## 2015-02-01 LAB — TSH: TSH: 1.308 u[IU]/mL (ref 0.350–4.500)

## 2015-02-01 MED ORDER — DROSPIRENONE-ETHINYL ESTRADIOL 3-0.02 MG PO TABS: 1.0000 | ORAL_TABLET | Freq: Every day | ORAL | Status: DC

## 2015-02-01 NOTE — Progress Notes (Signed)
Patient ID: Tara Chang, female   DOB: Jan 11, 1991, 24 y.o.   MRN: [id number] 24 y.o. G0P0 Single Hispanic female here for annual exam.    Having vaginal discharge.  Some odor.  No itching or burning.  Uses Summer's Eve external washes.   Happy with her OCPs.  Taking Yaz for acne.  Prescribed by dermatologist.  Had two menses this month. Skipped pills 2 days this month.  Now has an alarm on her phone.  Not sexually active currently.   Feels sleepy and tired.  Takes vit D.  Exercises regularly.  Has a low vitamin D level so is supplementing.   Was checked at work.  Working for [employer].  PCP:  None   Patient's last menstrual period was 12/21/2014 (exact date).          Sexually active: Yes.  female  The current method of family planning is OCP (estrogen/progesterone). Tara Chang   Exercising: Yes.    weight lifting. Smoker:  Yes, smokes tobacco in a Hookah on weekends.  Health Maintenance: Pap:  03-02-14 negative History of abnormal Pap:  no MMG:  n/a Colonoscopy:  n/a BMD:   n/a  Result  n/a TDaP:  03-27-13 Gardasil done 2 -3 years ago.  Screening Labs:  Hb today: through work, Urine today: Pos. Nitrites--asymptomatic   reports that she has been smoking Pipe.  She has never used smokeless tobacco. She reports that she drinks about 2.4 oz of alcohol per week. She reports that she does not use illicit drugs.  Past Medical History  Diagnosis Date  . Hyperlipemia   . Migraines     History reviewed. No pertinent past surgical history.  Current Outpatient Prescriptions  Medication Sig Dispense Refill  . drospirenone-ethinyl estradiol (YAZ,GIANVI,LORYNA) 3-0.02 MG tablet Take 1 tablet by mouth daily.    Marland Kitchen VITAMIN D, ERGOCALCIFEROL, PO Take 4,000 Units by mouth daily.     No current facility-administered medications for this visit.    Family History  Problem Relation Age of Onset  . Thyroid disease Mother   . Hypertension Maternal Grandmother   .  Migraines Maternal Grandfather     ROS:  Pertinent items are noted in HPI.  Otherwise, a comprehensive ROS was negative.  Exam:   BP 100/62 mmHg  Pulse 80  Resp 20  Ht 4' 11.25" (1.505 m)  Wt 110 lb (49.896 kg)  BMI 22.03 kg/m2  LMP 12/21/2014 (Exact Date)    General appearance: alert, cooperative and appears stated age Head: Normocephalic, without obvious abnormality, atraumatic Neck: no adenopathy, supple, symmetrical, trachea midline and thyroid normal to inspection and palpation Lungs: clear to auscultation bilaterally Breasts: normal appearance, no masses or tenderness, Inspection negative, No nipple retraction or dimpling Heart: regular rate and rhythm Abdomen: soft, non-tender; no masses,  no organomegaly Extremities: extremities normal, atraumatic, no cyanosis or edema Skin: Skin color, texture, turgor normal. No rashes or lesions Lymph nodes: Cervical, supraclavicular, and axillary nodes normal. No abnormal inguinal nodes palpated Neurologic: Grossly normal  Pelvic: External genitalia:  no lesions              Urethra:  normal appearing urethra with no masses, tenderness or lesions              Bartholins and Skenes: normal                 Vagina: normal appearing vagina with normal color and discharge, no lesions  Cervix: no lesions              Pap taken: No. Bimanual Exam:  Uterus:  normal size, contour, position, consistency, mobility, non-tender              Adnexa: normal adnexa and no mass, fullness, tenderness              Rectovaginal: No..     Chaperone was present for exam.  Assessment:   Well woman visit with normal exam. Screening for STDs. Fatigue.  Low Vit D level.  Vaginal odor.  Acne.   Plan: Yearly mammogram recommended after age 55.  Recommended self breast exam.  Pap and HR HPV as above. Discussed Calcium, Vitamin D, regular exercise program including cardiovascular and weight bearing exercise. Labs performed.  Yes.  .   See  orders.  Affirm done.  General labs and STD testing.  UC sent.  Refills given on medications.  Yes.  .  See orders.  Refill of Yaz for 3 months and 3 refills.  Follow up annually and prn.   After visit summary provided.

## 2015-02-01 NOTE — Patient Instructions (Signed)

## 2015-02-02 LAB — COMPREHENSIVE METABOLIC PANEL
ALBUMIN: 4 g/dL (ref 3.6–5.1)
ALT: 15 U/L (ref 6–29)
AST: 17 U/L (ref 10–30)
Alkaline Phosphatase: 49 U/L (ref 33–115)
BUN: 12 mg/dL (ref 7–25)
CHLORIDE: 104 meq/L (ref 98–110)
CO2: 24 meq/L (ref 20–31)
Calcium: 9.5 mg/dL (ref 8.6–10.2)
Creat: 0.7 mg/dL (ref 0.50–1.10)
Glucose, Bld: 82 mg/dL (ref 65–99)
Potassium: 4.7 mEq/L (ref 3.5–5.3)
Sodium: 137 mEq/L (ref 135–146)
Total Bilirubin: 0.3 mg/dL (ref 0.2–1.2)
Total Protein: 7.2 g/dL (ref 6.1–8.1)

## 2015-02-02 LAB — HEPATITIS C ANTIBODY: HCV AB: NEGATIVE

## 2015-02-02 LAB — VITAMIN D 25 HYDROXY (VIT D DEFICIENCY, FRACTURES): VIT D 25 HYDROXY: 37 ng/mL (ref 30–100)

## 2015-02-02 LAB — WET PREP BY MOLECULAR PROBE
Candida species: NEGATIVE
Gardnerella vaginalis: POSITIVE — AB
TRICHOMONAS VAG: NEGATIVE

## 2015-02-02 LAB — STD PANEL
HIV: NONREACTIVE
Hepatitis B Surface Ag: NEGATIVE

## 2015-02-02 LAB — GC/CHLAMYDIA PROBE AMP, URINE
CHLAMYDIA, SWAB/URINE, PCR: NEGATIVE
GC Probe Amp, Urine: NEGATIVE

## 2015-02-03 ENCOUNTER — Telehealth: Payer: Self-pay | Admitting: Emergency Medicine

## 2015-02-03 MED ORDER — SULFAMETHOXAZOLE-TRIMETHOPRIM 800-160 MG PO TABS: 1.0000 | ORAL_TABLET | Freq: Two times a day (BID) | ORAL | Status: DC

## 2015-02-03 MED ORDER — METRONIDAZOLE 0.75 % VA GEL: VAGINAL | Status: DC

## 2015-02-03 NOTE — Telephone Encounter (Signed)
Patient returned call and message from Dr. Edward Jolly given.  She is given instructions regarding Metrogel and Bactrim DS. She is advised of all lab results and advised final sensitivities pending and will receive a return call if needs additional treatment.  She is given number of helpdesk to reset her [employer]. Patient given alcohol precautions with use of metrogel and patient verbalized understanding. She is advised to call back with any concerning symptoms. Routing to provider for final review. Patient agreeable to disposition. Will close encounter.

## 2015-02-03 NOTE — Telephone Encounter (Signed)
-----   Message from Patton Salles, MD sent at 02/03/2015  5:52 AM EDT ----- Please contact patient regarding UTI and regarding her positive Affirm test for bacterial vaginosis.  I would like to treat her with Metrogel 0.75% pv at hs for 5 nights.  I would also like to give her an Rx for Bactrim DS one po bid for 3 days, #6, RF zero. (Final sensitivities are pending.) Please send to her pharmacy of choice.   All remaining blood work was normal, and the STD testing was negative.   I did release results to her in My Chart.  Cc - Claudette Laws

## 2015-02-04 LAB — URINE CULTURE

## 2015-04-21 ENCOUNTER — Ambulatory Visit: Payer: PRIVATE HEALTH INSURANCE | Admitting: Obstetrics and Gynecology

## 2015-11-09 ENCOUNTER — Telehealth: Payer: Self-pay | Admitting: Obstetrics and Gynecology

## 2015-11-09 NOTE — Telephone Encounter (Signed)
The patient passed some tissue yesterday that looked like skin, while on her normal cycle. She doesn't typically have cramps and she felt a cramp prior to passing the tissue. Her cycle was otherwise normal. She is on OCP's. No concerns of pregnancy.

## 2015-11-09 NOTE — Telephone Encounter (Signed)
Patient started cycle Tuesday and she felt something like a piece of skin come out, wanting to speak with nurse. Best # to reach: [phone]

## 2016-02-01 ENCOUNTER — Telehealth: Payer: Self-pay | Admitting: *Deleted

## 2016-02-01 NOTE — Telephone Encounter (Signed)
Pt would like to speak with nurse.  States she has had 2 periods one week apart. Patient still taking Yaz, has had missed/late pills.

## 2016-02-01 NOTE — Telephone Encounter (Signed)
Spoke with patient. Patient states that she is currently taking Yaz OCP. Reports she had her menses around 7/13-7/18. On 01/30/2016 she started her menses again. Reports bleeding is "like my normal cycle." Denies any heavy bleeding. States she is having a hard time remembering to take her OCP on time and sometimes misses doses. She has taken 2 UPT's which were both negative. She is interested in discussing alternative birth control options with Dr.Silva at her aex on 02/09/2016. Advised she may discuss this with Dr.Silva at that time, Advised if her bleeding becomes heavy, feels fatigued, light headed, or dizzy will need to be seen earlier for an appointment. Advised she will need to continue taking OCP at the same time daily without missing doses. She is agreeable.  Routing to provider for final review. Patient agreeable to disposition. Will close encounter.

## 2016-02-08 NOTE — Progress Notes (Signed)
25 y.o. G0P0 Single Hispanic female here for annual exam.    Taking birth control for acne initially.  Now is sexually active.  Forgets her pills.  2 menses in July due to missed menses.  Did a UPT at home at the end of July when she had a second menses. It was negative. Uses the NuvaRing in the past and did well with it.  Passed tissue with the last menses and 2 months ago.  Called in to the office was told it was probably endometrium.   Wants STD testing and routine labs.  Hx elevated cholesterol, elevated LFTs, and low vit D.  Has migraines without aura.  Occur 2 - 3 times per month and last for 3 - 4 days.  Occurs since elementary school. Has never seen a headache specialist. Has photophobia. No vomiting.   Studying real estate.  Bought a house 2 years ago.   PCP:  None   Patient's last menstrual period was 01/29/2016 (exact date).           Sexually active: Yes.   female The current method of family planning is OCP (estrogen/progesterone)--Gianvi.    Exercising: Yes.    weights Smoker: Yes, occ. Hookah.  Smokes not often.   Health Maintenance: Pap:  03-02-14 Neg History of abnormal Pap:  no MMG:  n/a Colonoscopy:  n/a BMD:   n/a  Result  n/a TDaP:  03-17-13 Gardasil:   Yes, completed 2015   Screening Labs:  Hb today: 12.9, Urine today: Neg   reports that she has been smoking.  She has never used smokeless tobacco. She reports that she drinks about 2.4 oz of alcohol per week . She reports that she does not use drugs.  Past Medical History:  Diagnosis Date  . Hyperlipemia   . Migraines    No aura.    History reviewed. No pertinent surgical history.  Current Outpatient Prescriptions  Medication Sig Dispense Refill  . etonogestrel-ethinyl estradiol (NUVARING) 0.12-0.015 MG/24HR vaginal ring Place 1 each vaginally every 28 (twenty-eight) days. Insert vaginally and leave in place for 3 consecutive weeks, then remove for 1 week. 1 each 11   No current  facility-administered medications for this visit.     Family History  Problem Relation Age of Onset  . Thyroid disease Mother   . Hypertension Maternal Grandmother   . Migraines Maternal Grandfather     ROS:  Pertinent items are noted in HPI.  Otherwise, a comprehensive ROS was negative.  Exam:   BP 102/76 (BP Location: Right Arm, Patient Position: Sitting, Cuff Size: Normal)   Pulse 76   Ht  (1.499 m)   Wt 105 lb 9.6 oz (47.9 kg)   LMP 01/29/2016 (Exact Date)   BMI 21.33 kg/m     General appearance: alert, cooperative and appears stated age Head: Normocephalic, without obvious abnormality, atraumatic Neck: no adenopathy, supple, symmetrical, trachea midline and thyroid normal to inspection and palpation Lungs: clear to auscultation bilaterally Breasts: normal appearance, no masses or tenderness, No nipple retraction or dimpling, No nipple discharge or bleeding, No axillary or supraclavicular adenopathy Heart: regular rate and rhythm Abdomen: soft, non-tender; no masses, no organomegaly Extremities: extremities normal, atraumatic, no cyanosis or edema Skin: Skin color, texture, turgor normal. No rashes or lesions Lymph nodes: Cervical, supraclavicular, and axillary nodes normal. No abnormal inguinal nodes palpated Neurologic: Grossly normal  Pelvic: External genitalia:  no lesions              Urethra:  normal appearing urethra with no masses, tenderness or lesions              Bartholins and Skenes: normal                 Vagina: normal appearing vagina with normal color and discharge, no lesions              Cervix: no lesions              Pap taken: No. Bimanual Exam:  Uterus:  normal size, contour, position, consistency, mobility, non-tender              Adnexa: no mass, fullness, tenderness             Chaperone was present for exam.  Assessment:   Well woman visit with normal exam. STD screening.  Hx elevated cholesterol and elevated LFTs.  No PCP.   Migraine headaches. No aura. Acne.   Plan: Yearly mammogram recommended after age 19.  Recommended self breast exam.  Pap and HR HPV as above. Recommendations for Calcium, Vitamin D, regular exercise program including cardiovascular and weight bearing exercise. STD screening and routine general labs.  Switch to NuvaRing for 12 months.  List of PCP providers.  I recommend establishing care.  Referral to Neurology for migraine headaches.   Follow up annually and prn.       After visit summary provided.

## 2016-02-09 ENCOUNTER — Ambulatory Visit (INDEPENDENT_AMBULATORY_CARE_PROVIDER_SITE_OTHER): Payer: Commercial Managed Care - PPO | Admitting: Obstetrics and Gynecology

## 2016-02-09 ENCOUNTER — Encounter: Payer: Self-pay | Admitting: Obstetrics and Gynecology

## 2016-02-09 VITALS — BP 102/76 | HR 76 | Ht 59.0 in | Wt 105.6 lb

## 2016-02-09 DIAGNOSIS — Z113 Encounter for screening for infections with a predominantly sexual mode of transmission: Secondary | ICD-10-CM

## 2016-02-09 DIAGNOSIS — Z0000 Encounter for general adult medical examination without abnormal findings: Secondary | ICD-10-CM | POA: Diagnosis not present

## 2016-02-09 DIAGNOSIS — G43809 Other migraine, not intractable, without status migrainosus: Secondary | ICD-10-CM

## 2016-02-09 DIAGNOSIS — A64 Unspecified sexually transmitted disease: Secondary | ICD-10-CM

## 2016-02-09 DIAGNOSIS — Z01419 Encounter for gynecological examination (general) (routine) without abnormal findings: Secondary | ICD-10-CM | POA: Diagnosis not present

## 2016-02-09 HISTORY — DX: Unspecified sexually transmitted disease: A64

## 2016-02-09 LAB — CBC
HEMATOCRIT: 39.4 % (ref 35.0–45.0)
Hemoglobin: 13.1 g/dL (ref 11.7–15.5)
MCH: 29.2 pg (ref 27.0–33.0)
MCHC: 33.2 g/dL (ref 32.0–36.0)
MCV: 87.8 fL (ref 80.0–100.0)
MPV: 9.8 fL (ref 7.5–12.5)
Platelets: 290 10*3/uL (ref 140–400)
RBC: 4.49 MIL/uL (ref 3.80–5.10)
RDW: 12.7 % (ref 11.0–15.0)
WBC: 6 10*3/uL (ref 3.8–10.8)

## 2016-02-09 LAB — COMPREHENSIVE METABOLIC PANEL WITH GFR
ALT: 15 U/L (ref 6–29)
AST: 17 U/L (ref 10–30)
Albumin: 4 g/dL (ref 3.6–5.1)
Alkaline Phosphatase: 57 U/L (ref 33–115)
BUN: 11 mg/dL (ref 7–25)
CO2: 24 mmol/L (ref 20–31)
Calcium: 9.1 mg/dL (ref 8.6–10.2)
Chloride: 104 mmol/L (ref 98–110)
Creat: 0.85 mg/dL (ref 0.50–1.10)
Glucose, Bld: 80 mg/dL (ref 65–99)
Potassium: 4.1 mmol/L (ref 3.5–5.3)
Sodium: 138 mmol/L (ref 135–146)
Total Bilirubin: 0.4 mg/dL (ref 0.2–1.2)
Total Protein: 7.2 g/dL (ref 6.1–8.1)

## 2016-02-09 LAB — POCT URINALYSIS DIPSTICK
Bilirubin, UA: NEGATIVE
Blood, UA: NEGATIVE
Glucose, UA: NEGATIVE
Ketones, UA: NEGATIVE
Leukocytes, UA: NEGATIVE
Nitrite, UA: NEGATIVE
Protein, UA: NEGATIVE
UROBILINOGEN UA: NEGATIVE
pH, UA: 5

## 2016-02-09 LAB — LIPID PANEL
CHOL/HDL RATIO: 3.2 ratio (ref <=5.0)
CHOLESTEROL: 240 mg/dL — AB (ref 125–200)
HDL: 76 mg/dL (ref >=46)
LDL Cholesterol: 139 mg/dL — ABNORMAL HIGH (ref <130)
TRIGLYCERIDES: 125 mg/dL (ref <150)
VLDL: 25 mg/dL (ref <30)

## 2016-02-09 LAB — TSH: TSH: 1.41 mIU/L

## 2016-02-09 MED ORDER — ETONOGESTREL-ETHINYL ESTRADIOL 0.12-0.015 MG/24HR VA RING: 1.0000 | VAGINAL_RING | VAGINAL | 11 refills | Status: DC

## 2016-02-09 NOTE — Patient Instructions (Signed)
Health Maintenance, Female Adopting a healthy lifestyle and getting preventive care can go a long way to promote health and wellness. Talk with your health care provider about what schedule of regular examinations is right for you. This is a good chance for you to check in with your provider about disease prevention and staying healthy. In between checkups, there are plenty of things you can do on your own. Experts have done a lot of research about which lifestyle changes and preventive measures are most likely to keep you healthy. Ask your health care provider for more information. WEIGHT AND DIET  Eat a healthy diet  Be sure to include plenty of vegetables, fruits, low-fat dairy products, and lean protein.  Do not eat a lot of foods high in solid fats, added sugars, or salt.  Get regular exercise. This is one of the most important things you can do for your health.  Most adults should exercise for at least 150 minutes each week. The exercise should increase your heart rate and make you sweat (moderate-intensity exercise).  Most adults should also do strengthening exercises at least twice a week. This is in addition to the moderate-intensity exercise.  Maintain a healthy weight  Body mass index (BMI) is a measurement that can be used to identify possible weight problems. It estimates body fat based on height and weight. Your health care provider can help determine your BMI and help you achieve or maintain a healthy weight.  For females 20 years of age and older:   A BMI below 18.5 is considered underweight.  A BMI of 18.5 to 24.9 is normal.  A BMI of 25 to 29.9 is considered overweight.  A BMI of 30 and above is considered obese.  Watch levels of cholesterol and blood lipids  You should start having your blood tested for lipids and cholesterol at 24 years of age, then have this test every 5 years.  You may need to have your cholesterol levels checked more often if:  Your lipid  or cholesterol levels are high.  You are older than 24 years of age.  You are at high risk for heart disease.  CANCER SCREENING   Lung Cancer  Lung cancer screening is recommended for adults 55-80 years old who are at high risk for lung cancer because of a history of smoking.  A yearly low-dose CT scan of the lungs is recommended for people who:  Currently smoke.  Have quit within the past 15 years.  Have at least a 30-pack-year history of smoking. A pack year is smoking an average of one pack of cigarettes a day for 1 year.  Yearly screening should continue until it has been 15 years since you quit.  Yearly screening should stop if you develop a health problem that would prevent you from having lung cancer treatment.  Breast Cancer  Practice breast self-awareness. This means understanding how your breasts normally appear and feel.  It also means doing regular breast self-exams. Let your health care provider know about any changes, no matter how small.  If you are in your 20s or 30s, you should have a clinical breast exam (CBE) by a health care provider every 1-3 years as part of a regular health exam.  If you are 40 or older, have a CBE every year. Also consider having a breast X-ray (mammogram) every year.  If you have a family history of breast cancer, talk to your health care provider about genetic screening.  If you   are at high risk for breast cancer, talk to your health care provider about having an MRI and a mammogram every year.  Breast cancer gene (BRCA) assessment is recommended for women who have family members with BRCA-related cancers. BRCA-related cancers include:  Breast.  Ovarian.  Tubal.  Peritoneal cancers.  Results of the assessment will determine the need for genetic counseling and BRCA1 and BRCA2 testing. Cervical Cancer Your health care provider may recommend that you be screened regularly for cancer of the pelvic organs (ovaries, uterus, and  vagina). This screening involves a pelvic examination, including checking for microscopic changes to the surface of your cervix (Pap test). You may be encouraged to have this screening done every 3 years, beginning at age 21.  For women ages 30-65, health care providers may recommend pelvic exams and Pap testing every 3 years, or they may recommend the Pap and pelvic exam, combined with testing for human papilloma virus (HPV), every 5 years. Some types of HPV increase your risk of cervical cancer. Testing for HPV may also be done on women of any age with unclear Pap test results.  Other health care providers may not recommend any screening for nonpregnant women who are considered low risk for pelvic cancer and who do not have symptoms. Ask your health care provider if a screening pelvic exam is right for you.  If you have had past treatment for cervical cancer or a condition that could lead to cancer, you need Pap tests and screening for cancer for at least 20 years after your treatment. If Pap tests have been discontinued, your risk factors (such as having a new sexual partner) need to be reassessed to determine if screening should resume. Some women have medical problems that increase the chance of getting cervical cancer. In these cases, your health care provider may recommend more frequent screening and Pap tests. Colorectal Cancer  This type of cancer can be detected and often prevented.  Routine colorectal cancer screening usually begins at 24 years of age and continues through 24 years of age.  Your health care provider may recommend screening at an earlier age if you have risk factors for colon cancer.  Your health care provider may also recommend using home test kits to check for hidden blood in the stool.  A small camera at the end of a tube can be used to examine your colon directly (sigmoidoscopy or colonoscopy). This is done to check for the earliest forms of colorectal  cancer.  Routine screening usually begins at age 50.  Direct examination of the colon should be repeated every 5-10 years through 24 years of age. However, you may need to be screened more often if early forms of precancerous polyps or small growths are found. Skin Cancer  Check your skin from head to toe regularly.  Tell your health care provider about any new moles or changes in moles, especially if there is a change in a mole's shape or color.  Also tell your health care provider if you have a mole that is larger than the size of a pencil eraser.  Always use sunscreen. Apply sunscreen liberally and repeatedly throughout the day.  Protect yourself by wearing long sleeves, pants, a wide-brimmed hat, and sunglasses whenever you are outside. HEART DISEASE, DIABETES, AND HIGH BLOOD PRESSURE   High blood pressure causes heart disease and increases the risk of stroke. High blood pressure is more likely to develop in:  People who have blood pressure in the high end   of the normal range (130-139/85-89 mm Hg).  People who are overweight or obese.  People who are African American.  If you are 38-23 years of age, have your blood pressure checked every 3-5 years. If you are 61 years of age or older, have your blood pressure checked every year. You should have your blood pressure measured twice--once when you are at a hospital or clinic, and once when you are not at a hospital or clinic. Record the average of the two measurements. To check your blood pressure when you are not at a hospital or clinic, you can use:  An automated blood pressure machine at a pharmacy.  A home blood pressure monitor.  If you are between 45 years and 39 years old, ask your health care provider if you should take aspirin to prevent strokes.  Have regular diabetes screenings. This involves taking a blood sample to check your fasting blood sugar level.  If you are at a normal weight and have a low risk for diabetes,  have this test once every three years after 24 years of age.  If you are overweight and have a high risk for diabetes, consider being tested at a younger age or more often. PREVENTING INFECTION  Hepatitis B  If you have a higher risk for hepatitis B, you should be screened for this virus. You are considered at high risk for hepatitis B if:  You were born in a country where hepatitis B is common. Ask your health care provider which countries are considered high risk.  Your parents were born in a high-risk country, and you have not been immunized against hepatitis B (hepatitis B vaccine).  You have HIV or AIDS.  You use needles to inject street drugs.  You live with someone who has hepatitis B.  You have had sex with someone who has hepatitis B.  You get hemodialysis treatment.  You take certain medicines for conditions, including cancer, organ transplantation, and autoimmune conditions. Hepatitis C  Blood testing is recommended for:  Everyone born from [birth year] through 1964.  Anyone with known risk factors for hepatitis C. Sexually transmitted infections (STIs)  You should be screened for sexually transmitted infections (STIs) including gonorrhea and chlamydia if:  You are sexually active and are younger than 24 years of age.  You are older than 24 years of age and your health care provider tells you that you are at risk for this type of infection.  Your sexual activity has changed since you were last screened and you are at an increased risk for chlamydia or gonorrhea. Ask your health care provider if you are at risk.  If you do not have HIV, but are at risk, it may be recommended that you take a prescription medicine daily to prevent HIV infection. This is called pre-exposure prophylaxis (PrEP). You are considered at risk if:  You are sexually active and do not regularly use condoms or know the HIV status of your partner(s).  You take drugs by injection.  You are sexually  active with a partner who has HIV. Talk with your health care provider about whether you are at high risk of being infected with HIV. If you choose to begin PrEP, you should first be tested for HIV. You should then be tested every 3 months for as long as you are taking PrEP.  PREGNANCY   If you are premenopausal and you may become pregnant, ask your health care provider about preconception counseling.  If you may  become pregnant, take 400 to 800 micrograms (mcg) of folic acid every day.  If you want to prevent pregnancy, talk to your health care provider about birth control (contraception). OSTEOPOROSIS AND MENOPAUSE   Osteoporosis is a disease in which the bones lose minerals and strength with aging. This can result in serious bone fractures. Your risk for osteoporosis can be identified using a bone density scan.  If you are 61 years of age or older, or if you are at risk for osteoporosis and fractures, ask your health care provider if you should be screened.  Ask your health care provider whether you should take a calcium or vitamin D supplement to lower your risk for osteoporosis.  Menopause may have certain physical symptoms and risks.  Hormone replacement therapy may reduce some of these symptoms and risks. Talk to your health care provider about whether hormone replacement therapy is right for you.  HOME CARE INSTRUCTIONS   Schedule regular health, dental, and eye exams.  Stay current with your immunizations.   Do not use any tobacco products including cigarettes, chewing tobacco, or electronic cigarettes.  If you are pregnant, do not drink alcohol.  If you are breastfeeding, limit how much and how often you drink alcohol.  Limit alcohol intake to no more than 1 drink per day for nonpregnant women. One drink equals 12 ounces of beer, 5 ounces of wine, or 1 ounces of hard liquor.  Do not use street drugs.  Do not share needles.  Ask your health care provider for help if  you need support or information about quitting drugs.  Tell your health care provider if you often feel depressed.  Tell your health care provider if you have ever been abused or do not feel safe at home.   This information is not intended to replace advice given to you by your health care provider. Make sure you discuss any questions you have with your health care provider.   Document Released: 01/07/2011 Document Revised: 07/15/2014 Document Reviewed: 05/26/2013 Elsevier Interactive Patient Education [employer].

## 2016-02-10 LAB — STD PANEL
HEP B S AG: NEGATIVE
HIV 1&2 Ab, 4th Generation: NONREACTIVE

## 2016-02-10 LAB — VITAMIN D 25 HYDROXY (VIT D DEFICIENCY, FRACTURES): VIT D 25 HYDROXY: 31 ng/mL (ref 30–100)

## 2016-02-10 LAB — GC/CHLAMYDIA PROBE AMP
CT Probe RNA: DETECTED — AB
GC Probe RNA: NOT DETECTED

## 2016-02-10 LAB — HEPATITIS C ANTIBODY: HCV Ab: NEGATIVE

## 2016-02-12 ENCOUNTER — Telehealth: Payer: Self-pay | Admitting: Emergency Medicine

## 2016-02-12 ENCOUNTER — Encounter: Payer: Self-pay | Admitting: Obstetrics and Gynecology

## 2016-02-12 LAB — HEMOGLOBIN, FINGERSTICK: HEMOGLOBIN, FINGERSTICK: 12.9 g/dL (ref 12.0–16.0)

## 2016-02-12 MED ORDER — AZITHROMYCIN 1 G PO PACK: 1.0000 | PACK | Freq: Once | ORAL | 0 refills | Status: AC

## 2016-02-12 NOTE — Telephone Encounter (Signed)
-----   Message from Patton SallesBrook E Amundson C Silva, MD sent at 02/12/2016  6:07 AM EDT ----- Please contact patient with results.   She has chlamydia.  I am recommending azithromycin 1 gram orally.  Any partners need to be treated.  Please report to the health department.   All other testing for STDs was negative including HIV, hepatitis B and C, gonorrhea, and syphilis.   Her cholesterol panel showed slightly elevated LDL cholesterol.  This can be lowered through a low cholesterol diet and increasing exercise.   The remaining blood work was normal including thyroid, vitamin D, CMP, and CBC.  Cc- Claudette LawsAmanda Dixon

## 2016-02-12 NOTE — Telephone Encounter (Signed)
Message from provider given and advised of positive Chlamydia Results.   Advised rx was sent to pharmacy, should take treatment as directed, ensure to take with food. Azithromycin 1 gram PO take at once with food sent to pharmacy of choice.   Advised will need to complete treatment and notify partner(s). Stressed need to notify partner and abstain until treatment. To wait seven days after they have completed treatment.   Scheduled appointment for 3 month test of cure.  Rescreen scheduled for 05/14/16.   Advised reportable condition and that report would be sent to health department.Confidential communicable disease report-Part one [facility], faxed with fax confirmation received and original sent to medical records.   Patient did not have any questions regarding testing/treatment at this time. Advised to call back with any, she is agreeable and verbalized understanding for very important need for treatment and follow up.   Patient also given remainder of lab results and verbalized understanding of results.  Routing to provider for final review. Patient agreeable to disposition. Will close encounter.

## 2016-02-12 NOTE — Telephone Encounter (Signed)
Message left to return call to Hermenia Fritcher at [phone].    

## 2016-02-13 ENCOUNTER — Telehealth: Payer: Self-pay | Admitting: Obstetrics and Gynecology

## 2016-02-13 MED ORDER — AZITHROMYCIN 1 G PO PACK: 1.0000 | PACK | Freq: Once | ORAL | 0 refills | Status: AC

## 2016-02-13 NOTE — Telephone Encounter (Signed)
Patient is asking to talk with Dr.Silva's nurse. [facility]

## 2016-02-13 NOTE — Telephone Encounter (Signed)
Spoke with patient. Patient states that she contacted her pharmacy and her rx was not there. Per review of chart rx for Azithromycin 1 gram needs to be sent in. See note below. Advised I have sent in rx at this time to the pharmacy on file. She is agreeable and verbalizes understanding.  Notes Recorded by Joeseph Amorracy L Fast, RN on 02/12/2016 at 12:05 PM EDT Telephone call message created for lab result contact. Please see telephone note dated in today's date.   ------  Notes Recorded by Patton SallesBrook E Amundson C Silva, MD on 02/12/2016 at 6:07 AM EDT Please contact patient with results.   She has chlamydia.  I am recommending azithromycin 1 gram orally.  Any partners need to be treated.  Please report to the health department.   All other testing for STDs was negative including HIV, hepatitis B and C, gonorrhea, and syphilis.   Her cholesterol panel showed slightly elevated LDL cholesterol. This can be lowered through a low cholesterol diet and increasing exercise.   The remaining blood work was normal including thyroid, vitamin D, CMP, and CBC.  Cc- Claudette LawsAmanda Dixon  Routing to provider for final review. Patient agreeable to disposition. Will close encounter.

## 2016-02-14 ENCOUNTER — Telehealth: Payer: Self-pay | Admitting: Obstetrics and Gynecology

## 2016-02-14 NOTE — Telephone Encounter (Signed)
Patient is asking to talk with a nurse regarding her results.

## 2016-02-14 NOTE — Telephone Encounter (Signed)
Spoke with patient. Patient states that she picked up Azithromycin rx last night and was given 2 tablets. Asking if this is the proper dosage. Patient is unaware of the dosage of medication she received. Advised I will contact the pharmacy to verify and return call. She is agreeable.   [facility] Patient received Azithromcyin 500 mg #2 0RF take 2 tablets by mouth once because the pharmacy does not carry Azithromycin 1 gm powder.   Left message for patient to call Kaitlyn at [phone].

## 2016-02-27 NOTE — Telephone Encounter (Signed)
Left message to call Kaitlyn at [phone]. 

## 2016-02-27 NOTE — Telephone Encounter (Signed)
Spoke with patient. Advised I spoke with the pharmacy and she did receive the correct/full dosage of Azithromycin 1 gram. Advised the pharmacy does not carry 1 gram powder so she was given Azithromycin 500 mg take 2 tablets at once which is correct. She is agreeable and verbalizes understanding. She is scheduled for a recheck on 05/15/2016 with Dr.Silva.  Routing to provider for final review. Patient agreeable to disposition. Will close encounter.

## 2016-03-05 ENCOUNTER — Encounter: Payer: Self-pay | Admitting: Neurology

## 2016-03-05 ENCOUNTER — Ambulatory Visit (INDEPENDENT_AMBULATORY_CARE_PROVIDER_SITE_OTHER): Payer: Commercial Managed Care - PPO | Admitting: Neurology

## 2016-03-05 VITALS — BP 112/78 | HR 86 | Ht 59.0 in | Wt 106.4 lb

## 2016-03-05 DIAGNOSIS — R110 Nausea: Secondary | ICD-10-CM

## 2016-03-05 DIAGNOSIS — R208 Other disturbances of skin sensation: Secondary | ICD-10-CM

## 2016-03-05 DIAGNOSIS — G43009 Migraine without aura, not intractable, without status migrainosus: Secondary | ICD-10-CM | POA: Diagnosis not present

## 2016-03-05 DIAGNOSIS — R200 Anesthesia of skin: Secondary | ICD-10-CM | POA: Insufficient documentation

## 2016-03-05 DIAGNOSIS — G43001 Migraine without aura, not intractable, with status migrainosus: Secondary | ICD-10-CM | POA: Insufficient documentation

## 2016-03-05 MED ORDER — SUMATRIPTAN SUCCINATE 100 MG PO TABS: 100.0000 mg | ORAL_TABLET | Freq: Once | ORAL | 12 refills | Status: DC | PRN

## 2016-03-05 MED ORDER — ONDANSETRON 4 MG PO TBDP: 4.0000 mg | ORAL_TABLET | Freq: Three times a day (TID) | ORAL | 12 refills | Status: DC | PRN

## 2016-03-05 MED ORDER — KETOROLAC TROMETHAMINE 60 MG/2ML IM SOLN
60.0000 mg | Freq: Once | INTRAMUSCULAR | Status: AC
  Administered 2016-03-05: 60 mg via INTRAMUSCULAR

## 2016-03-05 NOTE — Progress Notes (Signed)
[facility]    Provider:  Dr Lucia GaskinsAhern Referring Provider: Steve RattlerAmundson Silva, Debbe BalesBrook MD Primary Care Physician:  Steve RattlerAmundson Silva, Debbe BalesBrook MD   CC:  Frequent major headaches  HPI:  Tara Chang is a 25 y.o. female here as a referral from Dr. Zola ButtonLowne Chase for migraines. She has had migraines and headaches since the first grade without any known inciting event. Mother has migraines and maternal grandfather. She has bene able to handle them lasting a day or two with advil, not excessive OTC use maybe 2-3x a month. Migraines have been worsening for several months, She has had a migraine since Friday just improving today, she has never had a headache last this long. She has 5-6 headache days a month. They are on the right side around the temple and behind the eyes, throbbing, light sensitivity and needs to go into a dark room which helps, lights even at the gas station makes it bad, she has associated dizziness especially on movement of eyes, she is sensitive to sounds, smells don't bother her. On Average 4-5/10 in pain but can be severe 8/10 with nausea no vomiting, no aura. Hey eyes get red and her right eye waters. No prodrome. Slowly starts never acute in onset. Stress can trigger her headaches. This past headache wsa the worst and nothing helped but overall severity and frequency worsening but quality stable however advil used to work in the past and now not working. She has left episodic arm numbness, resolved with movement, paroxysmal and brief, not associated with neck pain or radicular symptoms, not associated with use, unknown triggers, movement makes it better, ongoing the last 3 months without inciting event. She feels blurry vision. She has left arm numbness, paroxysmal, occasional, brief.   Review of Systems: Patient complains of symptoms per HPI as well as the following symptoms: headache, dizziness, no CP, no SOB. Pertinent negatives per HPI. All others  negative.   Social History   Social History  . Marital status: Single    Spouse name: N/A  . Number of children: 0  . Years of education: 12   Occupational History  . [employer]    Social History Main Topics  . Smoking status: Light Tobacco Smoker  . Smokeless tobacco: Never Used     Comment: smokes Hookah on weekends only  . Alcohol use 2.4 oz/week    4 Standard drinks or equivalent per week     Comment: Socially--3 to 4 drinks/mo  . Drug use: No  . Sexual activity: Yes    Partners: Male    Birth control/ protection: OCP     Comment: Gianvi   Other Topics Concern  . Not on file   Social History Narrative   Lives with mother    Caffeine use: Drinks coffee (2-3 days per week)   Exercise--  2-3 days a week    Family History  Problem Relation Age of Onset  . Thyroid disease Mother   . Hypertension Maternal Grandmother   . Migraines Maternal Grandfather     Past Medical History:  Diagnosis Date  . Hyperlipemia   . Migraines    No aura.    Past Surgical History:  Procedure Laterality Date  . NO PAST SURGERIES      Current Outpatient Prescriptions  Medication Sig Dispense Refill  . etonogestrel-ethinyl estradiol (NUVARING) 0.12-0.015 MG/24HR vaginal ring Place 1 each vaginally every 28 (twenty-eight) days. Insert vaginally and leave in place for 3 consecutive weeks, then remove for 1  week. (Patient not taking: Reported on 03/05/2016) 1 each 11   No current facility-administered medications for this visit.     Allergies as of 03/05/2016  . (No Known Allergies)    Vitals: BP 112/78 (BP Location: Left Arm, Patient Position: Sitting, Cuff Size: Normal)   Pulse 86   Ht 4\' 11"  (1.499 m)   Wt 106 lb 6.4 oz (48.3 kg)   LMP 01/29/2016 (Exact Date)   BMI 21.49 kg/m  Last Weight:  Wt Readings from Last 1 Encounters:  03/05/16 106 lb 6.4 oz (48.3 kg)   Last Height:   Ht Readings from Last 1 Encounters:  03/05/16 4\' 11"  (1.499 m)   Physical  exam: Exam: Gen: NAD, conversant, well nourised, thin, well groomed                     CV: RRR, no MRG. No Carotid Bruits. No peripheral edema, warm, nontender Eyes: Conjunctivae clear without exudates or hemorrhage  Neuro: Detailed Neurologic Exam  Speech:    Speech is normal; fluent and spontaneous with normal comprehension.  Cognition:    The patient is oriented to person, place, and time;     recent and remote memory intact;     language fluent;     normal attention, concentration,     fund of knowledge Cranial Nerves:    The pupils are equal, round, and reactive to light. The fundi are normal and spontaneous venous pulsations are present. OS 20/20 OD 20/30 OU 20/20 Visual fields are full to finger confrontation. Extraocular movements are intact. Trigeminal sensation is intact and the muscles of mastication are normal. The face is symmetric. The palate elevates in the midline. Hearing intact. Voice is normal. Shoulder shrug is normal. The tongue has normal motion without fasciculations.   Coordination:    Normal finger to nose and heel to shin. Normal rapid alternating movements.   Gait:    Heel-toe and tandem gait are normal.   Motor Observation:    No asymmetry, no atrophy, and no involuntary movements noted. Tone:    Normal muscle tone.    Posture:    Posture is normal. normal erect    Strength:    Strength is V/V in the upper and lower limbs.      Sensation: intact to LT     Reflex Exam:  DTR's:    Deep tendon reflexes in the upper and lower extremities are normal bilaterally.   Toes:    The toes are downgoing bilaterally.   Clonus:    Clonus is absent.       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 Also complaining of episodic left arm numbness.   - Had a discussion with patient regarding migraine and migraine management at this time decided with patient not to start her on preventative medication  but to treat her migraines with acute management.  - As far as your medications are concerned, I would like to suggest: Imitrex at onset of headache. Please take one tablet at the onset of your headache. If it does not improve the symptoms please take one additional tablet. Do not take more then 2 tablets in 24hrs. Do not take use more then 2 to 3 days in a week. May take with Zofran for nausea. May use with advil. Discussed side effects as per patient instructions. These medications for possible birth defects do not use this pregnant and use birth control. - For her episodic  left arm pain I recommended keeping diary of triggers, times of days, what exacerbates it, what makes it better. We'll follow it clinically. If symptoms worsen or become more frequent recommend MRI of the brain and/or cervical spine, EMG nerve conduction study. - discussed: To prevent or relieve headaches, try the following: Cool Compress. Lie down and place a cool compress on your head.  Avoid headache triggers. If certain foods or odors seem to have triggered your migraines in the past, avoid them. A headache diary might help you identify triggers.  Include physical activity in your daily routine. Try a daily walk or other moderate aerobic exercise.  Manage stress. Find healthy ways to cope with the stressors, such as delegating tasks on your to-do list.  Practice relaxation techniques. Try deep breathing, yoga, massage and visualization.  Eat regularly. Eating regularly scheduled meals and maintaining a healthy diet might help prevent headaches. Also, drink plenty of fluids.  Follow a regular sleep schedule. Sleep deprivation might contribute to headaches Consider biofeedback. With this mind-body technique, you learn to control certain bodily functions - such as muscle tension, heart rate and blood pressure - to prevent headaches or reduce headache pain.    Proceed to emergency room if you experience new or worsening symptoms  or symptoms do not resolve, if you have new neurologic symptoms or if headache is severe, or for any concerning symptom.   Cc: Steve Rattler, Debbe Bales MD   Naomie Dean, MD  [facility] [address] [place], [place] [address]  Phone [phone] Fax [phone]

## 2016-03-05 NOTE — Patient Instructions (Addendum)
Remember to drink plenty of fluid, eat healthy meals and do not skip any meals. Try to eat protein with a every meal and eat a healthy snack such as fruit or nuts in between meals. Try to keep a regular sleep-wake schedule and try to exercise daily, particularly in the form of walking, 20-30 minutes a day, if you can.   As far as your medications are concerned, I would like to suggest: Imitrex at onset of headache. Please take one tablet at the onset of your headache. If it does not improve the symptoms please take one additional tablet. Do not take more then 2 tablets in 24hrs. Do not take use more then 2 to 3 days in a week. May take with Zofran for nausea. May use with advil.   Our phone number is [phone]. We also have an after hours call service for urgent matters and there is a physician on-call for urgent questions. For any emergencies you know to call 911 or go to the nearest emergency room  Sumatriptan tablets What is this medicine? SUMATRIPTAN (soo ma TRIP tan) is used to treat migraines with or without aura. An aura is a strange feeling or visual disturbance that warns you of an attack. It is not used to prevent migraines. This medicine may be used for other purposes; ask your health care provider or pharmacist if you have questions. What should I tell my health care provider before I take this medicine? They need to know if you have any of these conditions: -circulation problems in fingers and toes -diabetes -heart disease -high blood pressure -high cholesterol -history of irregular heartbeat -history of stroke -kidney disease -liver disease -postmenopausal or surgical removal of uterus and ovaries -seizures -smoke tobacco -stomach or intestine problems -an unusual or allergic reaction to sumatriptan, other medicines, foods, dyes, or preservatives -pregnant or trying to get pregnant -breast-feeding How should I use this medicine? Take this medicine by mouth with a glass  of water. Follow the directions on the prescription label. This medicine is taken at the first symptoms of a migraine. It is not for everyday use. If your migraine headache returns after one dose, you can take another dose as directed. You must leave at least 2 hours between doses, and do not take more than 100 mg as a single dose. Do not take more than 200 mg total in any 24 hour period. If there is no improvement at all after the first dose, do not take a second dose without talking to your doctor or health care professional. Do not take your medicine more often than directed. Talk to your pediatrician regarding the use of this medicine in children. Special care may be needed. Overdosage: If you think you have taken too much of this medicine contact a poison control center or emergency room at once. NOTE: This medicine is only for you. Do not share this medicine with others. What if I miss a dose? This does not apply; this medicine is not for regular use. What may interact with this medicine? Do not take this medicine with any of the following medicines: -cocaine -ergot alkaloids like dihydroergotamine, ergonovine, ergotamine, methylergonovine -feverfew -MAOIs like Carbex, Eldepryl, Marplan, Nardil, and Parnate -other medicines for migraine headache like almotriptan, eletriptan, frovatriptan, naratriptan, rizatriptan, zolmitriptan -tryptophan This medicine may also interact with the following medications: -certain medicines for depression, anxiety, or psychotic disturbances This list may not describe all possible interactions. Give your health care provider a list of all the medicines,  herbs, non-prescription drugs, or dietary supplements you use. Also tell them if you smoke, drink alcohol, or use illegal drugs. Some items may interact with your medicine. What should I watch for while using this medicine? Only take this medicine for a migraine headache. Take it if you get warning symptoms or at  the start of a migraine attack. It is not for regular use to prevent migraine attacks. You may get drowsy or dizzy. Do not drive, use machinery, or do anything that needs mental alertness until you know how this medicine affects you. To reduce dizzy or fainting spells, do not sit or stand up quickly, especially if you are an older patient. Alcohol can increase drowsiness, dizziness and flushing. Avoid alcoholic drinks. Smoking cigarettes may increase the risk of heart-related side effects from using this medicine. If you take migraine medicines for 10 or more days a month, your migraines may get worse. Keep a diary of headache days and medicine use. Contact your healthcare professional if your migraine attacks occur more frequently. What side effects may I notice from receiving this medicine? Side effects that you should report to your doctor or health care professional as soon as possible: -allergic reactions like skin rash, itching or hives, swelling of the face, lips, or tongue -bloody or watery diarrhea -hallucination, loss of contact with reality -pain, tingling, numbness in the face, hands, or feet -seizures -signs and symptoms of a blood clot such as breathing problems; changes in vision; chest pain; severe, sudden headache; pain, swelling, warmth in the leg; trouble speaking; sudden numbness or weakness of the face, arm, or leg -signs and symptoms of a dangerous change in heartbeat or heart rhythm like chest pain; dizziness; fast or irregular heartbeat; palpitations, feeling faint or lightheaded; falls; breathing problems -signs and symptoms of a stroke like changes in vision; confusion; trouble speaking or understanding; severe headaches; sudden numbness or weakness of the face, arm, or leg; trouble walking; dizziness; loss of balance or coordination -stomach pain Side effects that usually do not require medical attention (report these to your doctor or health care professional if they  continue or are bothersome): -changes in taste -facial flushing -headache -muscle cramps -muscle pain -nausea, vomiting -weak or tired This list may not describe all possible side effects. Call your doctor for medical advice about side effects. You may report side effects to FDA at 1-800-FDA-1088. Where should I keep my medicine? Keep out of the reach of children. Store at room temperature between 2 and 30 degrees C (36 and 86 degrees F). Throw away any unused medicine after the expiration date. NOTE: This sheet is a summary. It may not cover all possible information. If you have questions about this medicine, talk to your doctor, pharmacist, or health care provider.    2016, Elsevier/Gold Standard. (2014-12-29 17:46:40)   Ondansetron oral dissolving tablet What is this medicine? ONDANSETRON (on DAN se tron) is used to treat nausea and vomiting caused by chemotherapy. It is also used to prevent or treat nausea and vomiting after surgery. This medicine may be used for other purposes; ask your health care provider or pharmacist if you have questions. What should I tell my health care provider before I take this medicine? They need to know if you have any of these conditions: -heart disease -history of irregular heartbeat -liver disease -low levels of magnesium or potassium in the blood -an unusual or allergic reaction to ondansetron, granisetron, other medicines, foods, dyes, or preservatives -pregnant or trying to get pregnant -  breast-feeding How should I use this medicine? These tablets are made to dissolve in the mouth. Do not try to push the tablet through the foil backing. With dry hands, peel away the foil backing and gently remove the tablet. Place the tablet in the mouth and allow it to dissolve, then swallow. While you may take these tablets with water, it is not necessary to do so. Talk to your pediatrician regarding the use of this medicine in children. Special care may be  needed. Overdosage: If you think you have taken too much of this medicine contact a poison control center or emergency room at once. NOTE: This medicine is only for you. Do not share this medicine with others. What if I miss a dose? If you miss a dose, take it as soon as you can. If it is almost time for your next dose, take only that dose. Do not take double or extra doses. What may interact with this medicine? Do not take this medicine with any of the following medications: -apomorphine -certain medicines for fungal infections like fluconazole, itraconazole, ketoconazole, posaconazole, voriconazole -cisapride -dofetilide -dronedarone -pimozide -thioridazine -ziprasidone This medicine may also interact with the following medications: -carbamazepine -certain medicines for depression, anxiety, or psychotic disturbances -fentanyl -linezolid -MAOIs like Carbex, Eldepryl, Marplan, Nardil, and Parnate -methylene blue (injected into a vein) -other medicines that prolong the QT interval (cause an abnormal heart rhythm) -phenytoin -rifampicin -tramadol This list may not describe all possible interactions. Give your health care provider a list of all the medicines, herbs, non-prescription drugs, or dietary supplements you use. Also tell them if you smoke, drink alcohol, or use illegal drugs. Some items may interact with your medicine. What should I watch for while using this medicine? Check with your doctor or health care professional as soon as you can if you have any sign of an allergic reaction. What side effects may I notice from receiving this medicine? Side effects that you should report to your doctor or health care professional as soon as possible: -allergic reactions like skin rash, itching or hives, swelling of the face, lips, or tongue -breathing problems -confusion -dizziness -fast or irregular heartbeat -feeling faint or lightheaded, falls -fever and chills -loss of balance  or coordination -seizures -sweating -swelling of the hands and feet -tightness in the chest -tremors -unusually weak or tired Side effects that usually do not require medical attention (report to your doctor or health care professional if they continue or are bothersome): -constipation or diarrhea -headache This list may not describe all possible side effects. Call your doctor for medical advice about side effects. You may report side effects to FDA at 1-800-FDA-1088. Where should I keep my medicine? Keep out of the reach of children. Store between 2 and 30 degrees C (36 and 86 degrees F). Throw away any unused medicine after the expiration date. NOTE: This sheet is a summary. It may not cover all possible information. If you have questions about this medicine, talk to your doctor, pharmacist, or health care provider.    2016, Elsevier/Gold Standard. (2013-03-31 16:21:52)

## 2016-03-05 NOTE — Progress Notes (Signed)
Gave Toradol 60mg/2ml IM in left deltoid. Cleaned with alcohol wipe prior to injection. Band-aid applied. Pt tolerated well.   

## 2016-05-15 ENCOUNTER — Ambulatory Visit (INDEPENDENT_AMBULATORY_CARE_PROVIDER_SITE_OTHER): Payer: Commercial Managed Care - PPO | Admitting: Obstetrics and Gynecology

## 2016-05-15 ENCOUNTER — Encounter: Payer: Self-pay | Admitting: Obstetrics and Gynecology

## 2016-05-15 VITALS — BP 110/74 | HR 80 | Ht 59.0 in | Wt 106.0 lb

## 2016-05-15 DIAGNOSIS — Z3009 Encounter for other general counseling and advice on contraception: Secondary | ICD-10-CM

## 2016-05-15 DIAGNOSIS — A749 Chlamydial infection, unspecified: Secondary | ICD-10-CM

## 2016-05-15 NOTE — Progress Notes (Signed)
Patient ID: Tara Chang, female   DOB: 1991/05/22, 25 y.o.   MRN: [id number]  GYNECOLOGY  VISIT   HPI: 25 y.o.   Single Hispanic  female   [id number] with Patient's last menstrual period was 01/29/2016 (exact date).   here for Chlamydia TOC.  Positive chlamydia on 02/09/16.  Tx with Azithromycin 1 gram orally.  Partner was treated as well.  No new partners.  Not using condoms.   No vaginal discharge or irritation or burning.   Had full STD testing on 02/09/16 which was negative.   Using NuvaRing for 4 weeks at at time.   Has migraine without aura.   GYNECOLOGIC HISTORY: Patient's last menstrual period was 01/29/2016 (exact date). Contraception:  NuvaRing and condoms Menopausal hormone therapy:  N/A Last mammogram:  N/A Last pap smear: 03/02/14        OB History    Gravida Para Term Preterm AB Living   0 0 0 0 0 0   SAB TAB Ectopic Multiple Live Births   0 0 0 0 0         Patient Active Problem List   Diagnosis Date Noted  . Migraine without aura and with status migrainosus, not intractable 03/05/2016  . Left arm numbness 03/05/2016    Past Medical History:  Diagnosis Date  . Hyperlipemia   . Migraines    No aura.  . STD (sexually transmitted disease) 02/09/2016   Chlamydia    Past Surgical History:  Procedure Laterality Date  . NO PAST SURGERIES      Current Outpatient Prescriptions  Medication Sig Dispense Refill  . etonogestrel-ethinyl estradiol (NUVARING) 0.12-0.015 MG/24HR vaginal ring Place 1 each vaginally every 28 (twenty-eight) days. Insert vaginally and leave in place for 3 consecutive weeks, then remove for 1 week. 1 each 11  . ondansetron (ZOFRAN ODT) 4 MG disintegrating tablet Take 1 tablet (4 mg total) by mouth every 8 (eight) hours as needed for nausea or vomiting. 20 tablet 12  . SUMAtriptan (IMITREX) 100 MG tablet Take 1 tablet (100 mg total) by mouth once as needed for migraine. May repeat in 2 hours if headache persists or recurs. 10  tablet 12   No current facility-administered medications for this visit.      ALLERGIES: Patient has no known allergies.  Family History  Problem Relation Age of Onset  . Thyroid disease Mother   . Hypertension Maternal Grandmother   . Migraines Maternal Grandfather     Social History   Social History  . Marital status: Single    Spouse name: N/A  . Number of children: 0  . Years of education: 12   Occupational History  . [employer]    Social History Main Topics  . Smoking status: Light Tobacco Smoker  . Smokeless tobacco: Never Used     Comment: smokes Hookah on weekends only  . Alcohol use 2.4 oz/week    4 Standard drinks or equivalent per week     Comment: Socially--3 to 4 drinks/mo  . Drug use: No  . Sexual activity: Yes    Partners: Male    Birth control/ protection: OCP     Comment: Gianvi   Other Topics Concern  . Not on file   Social History Narrative   Lives with mother    Caffeine use: Drinks coffee (2-3 days per week)   Exercise--  2-3 days a week    ROS:  Pertinent items are noted in HPI.  PHYSICAL EXAMINATION:  BP 110/74 (BP Location: Right Arm, Patient Position: Sitting, Cuff Size: Normal)   Pulse 80   Ht 4\' 11"  (1.499 m)   Wt 106 lb (48.1 kg)   LMP 01/29/2016 (Exact Date) Comment: continuous NuvaRing  BMI 21.41 kg/m     General appearance: alert, cooperative and appears stated age  Pelvic: External genitalia:  no lesions              Urethra:  normal appearing urethra with no masses, tenderness or lesions              Bartholins and Skenes: normal                 Vagina: normal appearing vagina with normal color and discharge, no lesions              Cervix: no lesions                Bimanual Exam:  Uterus:  normal size, contour, position, consistency, mobility, non-tender.  Ring in place.              Adnexa: no mass, fullness, tenderness            Chaperone was present for exam.  ASSESSMENT  Chlamydia infection.  Use of  NuvaRing.   PLAN  GC/CT taken from cervix.  Discussed condom use - female and female. Discussed use of NuvaRing for 21 days and then remove for 7 days at which time a menses will appear. Then place a new Ring for 21 more days, etc.  Follow up for annual exam and prn.    An After Visit Summary was printed and given to the patient.  __15____ minutes face to face time of which over 50% was spent in counseling.

## 2016-05-16 LAB — GC/CHLAMYDIA PROBE AMP
CT PROBE, AMP APTIMA: NOT DETECTED
GC Probe RNA: NOT DETECTED

## 2016-05-17 ENCOUNTER — Telehealth: Payer: Self-pay | Admitting: Obstetrics and Gynecology

## 2016-05-17 NOTE — Telephone Encounter (Signed)
Patient calling because she received an alert on her mychart and she's calling to check if it's her lab results.

## 2016-05-17 NOTE — Telephone Encounter (Signed)
Spoke with patient. Patient states she received an alert that she had a MyChart message but was unable to log in. Reviewed patients results as seen below per Dr. Edward JollySilva. Patient verbalizes understanding and is agreeable. Advised patient she may contact the number on MyChart web site for assistance with re-setting password.     Notes Recorded by Patton SallesBrook E Amundson C Silva, MD on 05/17/2016 at 12:13 AM EST Results to patient through My Chart.  Hello Alexia,   Your testing is negative for chlamydia and gonorrhea.   Have a good weekend.  Conley SimmondsBrook Silva, MD

## 2016-05-21 ENCOUNTER — Telehealth: Payer: Self-pay | Admitting: Obstetrics and Gynecology

## 2016-05-21 NOTE — Telephone Encounter (Signed)
Patient would like to speak with a nurse to make sure she is using her Nuvaring correctly

## 2016-05-21 NOTE — Telephone Encounter (Signed)
Spoke with patient. Patient would like to ensure that she is replacing her Nuvaring on time. Patient is using an app to track when she is due to replace her Nuvaring. Patient is wearing her Nuvaring continuously. States that app is set for every 31 days. Advised she will need to remove and replace her ring every 4 weeks. Patient last removed and replaced her Nuvaring on 04/27/2016. Advised she will need to remove and replace her ring on 05/25/2016. Advised she will need to set a reminder for every 4 weeks from 05/25/2016 this will generate a reminder for exactly every 4 weeks to remove and replace her Nuvaring. Patient is agreeable and verbalizes understanding.   Routing to covering provider for final review. Patient agreeable to disposition. Will close encounter.

## 2016-06-11 ENCOUNTER — Ambulatory Visit: Payer: Commercial Managed Care - PPO | Admitting: Neurology

## 2016-06-18 ENCOUNTER — Ambulatory Visit (INDEPENDENT_AMBULATORY_CARE_PROVIDER_SITE_OTHER): Payer: Commercial Managed Care - PPO | Admitting: Neurology

## 2016-06-18 ENCOUNTER — Encounter: Payer: Self-pay | Admitting: Neurology

## 2016-06-18 VITALS — BP 117/82 | HR 96 | Ht 59.0 in | Wt 104.8 lb

## 2016-06-18 DIAGNOSIS — G43009 Migraine without aura, not intractable, without status migrainosus: Secondary | ICD-10-CM | POA: Diagnosis not present

## 2016-06-18 MED ORDER — ZOLMITRIPTAN 5 MG PO TABS: 5.0000 mg | ORAL_TABLET | Freq: Once | ORAL | 6 refills | Status: DC | PRN

## 2016-06-18 NOTE — Progress Notes (Signed)
[facility]   Provider:  Dr Lucia GaskinsAhern Referring Provider: Steve RattlerAmundson Silva, Debbe BalesBrook MD Primary Care Physician:  Steve RattlerAmundson Silva, Debbe BalesBrook MD   CC:  Frequent major headaches  Interval history 06/18/2016:Headaches 6 days a month. Imitrex making her tired. But can;t take it at work. Imitrex helps but makes her sleepy. She had nausea once and the anti-nausea medication helped.   HPI:  Tara Chang is a 25 y.o. female here as a referral from Dr. Zola ButtonLowne Chase for migraines. She has had migraines and headaches since the first grade without any known inciting event. Mother has migraines and maternal grandfather. She has bene able to handle them lasting a day or two with advil, not excessive OTC use maybe 2-3x a month. Migraines have been worsening for several months, She has had a migraine since Friday just improving today, she has never had a headache last this long. She has 5-6 headache days a month. They are on the right side around the temple and behind the eyes, throbbing, light sensitivity and needs to go into a dark room which helps, lights even at the gas station makes it bad, she has associated dizziness especially on movement of eyes, she is sensitive to sounds, smells don't bother her. On Average 4-5/10 in pain but can be severe 8/10 with nausea no vomiting, no aura. Hey eyes get red and her right eye waters. No prodrome. Slowly starts never acute in onset. Stress can trigger her headaches. This past headache wsa the worst and nothing helped but overall severity and frequency worsening but quality stable however advil used to work in the past and now not working. She has left episodic arm numbness, resolved with movement, paroxysmal and brief, not associated with neck pain or radicular symptoms, not associated with use, unknown triggers, movement makes it better, ongoing the last 3 months without inciting event. She feels blurry vision. She has left arm numbness, paroxysmal,  occasional, brief.   Review of Systems: Patient complains of symptoms per HPI as well as the following symptoms: headache, dizziness, no CP, no SOB. Pertinent negatives per HPI. All others negative.  Social History   Social History  . Marital status: Single    Spouse name: N/A  . Number of children: 0  . Years of education: 12   Occupational History  . [employer]    Social History Main Topics  . Smoking status: Light Tobacco Smoker  . Smokeless tobacco: Never Used     Comment: smokes Hookah on weekends only  . Alcohol use 2.4 oz/week    4 Standard drinks or equivalent per week     Comment: Socially--3 to 4 drinks/mo  . Drug use: No  . Sexual activity: Yes    Partners: Male    Birth control/ protection: OCP     Comment: Gianvi   Other Topics Concern  . Not on file   Social History Narrative   Lives with mother    Caffeine use: Drinks coffee (2-3 days per week)   Exercise--  2-3 days a week    Family History  Problem Relation Age of Onset  . Thyroid disease Mother   . Hypertension Maternal Grandmother   . Migraines Maternal Grandfather     Past Medical History:  Diagnosis Date  . Hyperlipemia   . Migraines    No aura.  . STD (sexually transmitted disease) 02/09/2016   Chlamydia    Past Surgical History:  Procedure Laterality Date  . NO PAST SURGERIES  Current Outpatient Prescriptions  Medication Sig Dispense Refill  . etonogestrel-ethinyl estradiol (NUVARING) 0.12-0.015 MG/24HR vaginal ring Place 1 each vaginally every 28 (twenty-eight) days. Insert vaginally and leave in place for 3 consecutive weeks, then remove for 1 week. 1 each 11  . ondansetron (ZOFRAN ODT) 4 MG disintegrating tablet Take 1 tablet (4 mg total) by mouth every 8 (eight) hours as needed for nausea or vomiting. 20 tablet 12  . SUMAtriptan (IMITREX) 100 MG tablet Take 1 tablet (100 mg total) by mouth once as needed for migraine. May repeat in 2 hours if headache persists or  recurs. 10 tablet 12   No current facility-administered medications for this visit.     Allergies as of 06/18/2016  . (No Known Allergies)    Vitals: BP 117/82 (BP Location: Right Arm, Patient Position: Sitting, Cuff Size: Normal)   Pulse 96   Ht 4\' 11"  (1.499 m)   Wt 104 lb 12.8 oz (47.5 kg)   BMI 21.17 kg/m  Last Weight:  Wt Readings from Last 1 Encounters:  06/18/16 104 lb 12.8 oz (47.5 kg)   Last Height:   Ht Readings from Last 1 Encounters:  06/18/16 4\' 11"  (1.499 m)    Physical exam: Exam: Gen: NAD, conversant, well nourised, thin, well groomed                     CV: RRR, no MRG. No Carotid Bruits. No peripheral edema, warm, nontender Eyes: Conjunctivae clear without exudates or hemorrhage  Neuro: Detailed Neurologic Exam  Speech:    Speech is normal; fluent and spontaneous with normal comprehension.  Cognition:    The patient is oriented to person, place, and time;     recent and remote memory intact;     language fluent;     normal attention, concentration,     fund of knowledge Cranial Nerves:    The pupils are equal, round, and reactive to light. The fundi are normal and spontaneous venous pulsations are present. OS 20/20 OD 20/30 OU 20/20 Visual fields are full to finger confrontation. Extraocular movements are intact. Trigeminal sensation is intact and the muscles of mastication are normal. The face is symmetric. The palate elevates in the midline. Hearing intact. Voice is normal. Shoulder shrug is normal. The tongue has normal motion without fasciculations.   Coordination:    Normal finger to nose and heel to shin. Normal rapid alternating movements.   Gait:    Heel-toe and tandem gait are normal.   Motor Observation:    No asymmetry, no atrophy, and no involuntary movements noted. Tone:    Normal muscle tone.    Posture:    Posture is normal. normal erect    Strength:    Strength is V/V in the upper and lower limbs.      Sensation:  intact to LT     Reflex Exam:  DTR's:    Deep tendon reflexes in the upper and lower extremities are normal bilaterally.   Toes:    The toes are downgoing bilaterally.   Clonus:    Clonus is absent.       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 Also complaining of episodic left arm numbness.   - Had a discussion with patient regarding migraine and migraine management at this time decided with patient not to start her on preventative medication but to treat her migraines with acute management.  - As far as your medications are  concerned, I would like to suggest: Zomig at onset of headache. Please take one tablet at the onset of your headache. If it does not improve the symptoms please take one additional tablet. Do not take more then 2 tablets in 24hrs. Do not take use more then 2 to 3 days in a week. May take with Zofran for nausea. May use with advil. Discussed side effects as per patient instructions. These medications for possible birth defects do not use this pregnant and use birth control. - For her episodic left arm pain I recommended keeping diary of triggers, times of days, what exacerbates it, what makes it better. We'll follow it clinically. If symptoms worsen or become more frequent recommend MRI of the brain and/or cervical spine, EMG nerve conduction study. - discussed: To prevent or relieve headaches, try the following:  Cool Compress. Lie down and place a cool compress on your head.   Avoid headache triggers. If certain foods or odors seem to have triggered your migraines in the past, avoid them. A headache diary might help you identify triggers.   Include physical activity in your daily routine. Try a daily walk or other moderate aerobic exercise.   Manage stress. Find healthy ways to cope with the stressors, such as delegating tasks on your to-do list.   Practice relaxation techniques. Try deep breathing,  yoga, massage and visualization.   Eat regularly. Eating regularly scheduled meals and maintaining a healthy diet might help prevent headaches. Also, drink plenty of fluids.   Follow a regular sleep schedule. Sleep deprivation might contribute to headaches  Consider biofeedback. With this mind-body technique, you learn to control certain bodily functions - such as muscle tension, heart rate and blood pressure - to prevent headaches or reduce headache pain.    Proceed to emergency room if you experience new or worsening symptoms or symptoms do not resolve, if you have new neurologic symptoms or if headache is severe, or for any concerning symptom.   Cc: Steve RattlerAmundson Silva, Debbe BalesBrook MD A total of 30 minutes was spent face-to-face with this patient. Over half this time was spent on counseling patient on the migraine diagnosis and different diagnostic and therapeutic options available.

## 2016-06-18 NOTE — Patient Instructions (Signed)
Remember to drink plenty of fluid, eat healthy meals and do not skip any meals. Try to eat protein with a every meal and eat a healthy snack such as fruit or nuts in between meals. Try to keep a regular sleep-wake schedule and try to exercise daily, particularly in the form of walking, 20-30 minutes a day, if you can.   As far as your medications are concerned, I would like to suggest: As far as your medications are concerned, I would like to suggest: Zomig at onset of headache. Please take one tablet at the onset of your headache. If it does not improve the symptoms please take one additional tablet. Do not take more then 2 tablets in 24hrs. Do not take use more then 2 to 3 days in a week. May take with Zofran for nausea. May use with advil.   I would like to see you back as needed or if headache worsen, sooner if we need to. Please call us with any interim questions, concerns, problems, updates or refill requests.    Our phone number is [phone]. We also have an after hours call service for urgent matters and there is a physician on-call for urgent questions. For any emergencies you know to call 911 or go to the nearest emergency room

## 2016-08-13 ENCOUNTER — Telehealth: Payer: Self-pay | Admitting: Obstetrics and Gynecology

## 2016-08-13 NOTE — Telephone Encounter (Signed)
Spoke with patient. Patient states that she removed her Nuvaring on 08/10/2016. Patient had unprotected intercourse on 08/12/2016. Patient states she usually started her menses on Tuesday or Wednesday after removing her Nuvaring. Has not started cycle yet today. Aware menses may still start tomorrow. Asking for advise on how to start new ring and risk for pregnancy. Advised I will review with MD and return cal with further recommendations. Patient is agreeable.  Routing to Dr.Jertson and Dr.Silva is out of the office today.

## 2016-08-13 NOTE — Telephone Encounter (Signed)
Reviewed with Dr. Oscar LaJertson. Was advised if Nuvaring was in for 3 consecutive weeks, should be fine to replace when due.   Spoke with patient, advised as seen above per Dr. Oscar LaJertson. Patient states she has used Nuvaring for 3 consecutive weeks and has had no missed cycles previously.  Advised patient to continue to monitor for cycle, if no menses and is concerned for pregnancy, may take UPT  or return call to office. Patient verbalizes understanding and is agreeable.  Cc: Dr. Edward JollySilva

## 2016-08-13 NOTE — Telephone Encounter (Signed)
Patient called and requested to speak with the nurse about her birth control. °

## 2017-02-03 ENCOUNTER — Telehealth: Payer: Self-pay | Admitting: Obstetrics and Gynecology

## 2017-02-03 MED ORDER — ETONOGESTREL-ETHINYL ESTRADIOL 0.12-0.015 MG/24HR VA RING: 1.0000 | VAGINAL_RING | VAGINAL | 0 refills | Status: DC

## 2017-02-03 NOTE — Telephone Encounter (Signed)
Patient states she is having a hard time getting her birth control refilled at her pharmacy. Uses Nuva-Ring. States now she is late in changing her ring.  Patient agreeable to return call from nurse.

## 2017-02-03 NOTE — Telephone Encounter (Signed)
I recommend starting the next NuvaRing with her next menstrual cycle.  OK to send 1 ring to her pharmacy.  No refills.  Use condoms and spermicide. Keep annual exam appointment.

## 2017-02-03 NOTE — Telephone Encounter (Signed)
Spoke with patient. States she has been requesting refill of Nuvaring from pharmacy for 2 weeks now with no response. Was due to replace Nuvaring on Saturday, removed last Nuvaring on 01/25/17. Next AEX scheduled for 02/21/17 with Dr. Edward JollySilva. Advised patient will review with Dr. Edward JollySilva and return call with recommendations, patient verbalizes understanding and is agreeable.   Dr. Edward JollySilva, ok to send refill for NUvaring x1? Please advise on restart?

## 2017-02-03 NOTE — Telephone Encounter (Signed)
Spoke with patient, advised as seen below per Dr. Edward JollySilva. Rx for Nuvaring #1/0RF to verified pharmacy on file. Patient verbalizes understanding and is agreeable.  Will close encounter.

## 2017-02-13 ENCOUNTER — Ambulatory Visit: Payer: Commercial Managed Care - PPO | Admitting: Obstetrics and Gynecology

## 2017-02-21 ENCOUNTER — Ambulatory Visit: Payer: Commercial Managed Care - PPO | Admitting: Obstetrics and Gynecology

## 2017-02-25 ENCOUNTER — Encounter: Payer: Self-pay | Admitting: Obstetrics and Gynecology

## 2017-02-25 ENCOUNTER — Ambulatory Visit (INDEPENDENT_AMBULATORY_CARE_PROVIDER_SITE_OTHER): Payer: Commercial Managed Care - PPO | Admitting: Obstetrics and Gynecology

## 2017-02-25 ENCOUNTER — Other Ambulatory Visit (HOSPITAL_COMMUNITY)
Admission: RE | Admit: 2017-02-25 | Discharge: 2017-02-25 | Disposition: A | Discharge status: Home / Self Care | Payer: Commercial Managed Care - PPO | Source: Ambulatory Visit | Attending: Obstetrics and Gynecology | Admitting: Obstetrics and Gynecology

## 2017-02-25 VITALS — BP 102/60 | HR 92 | Resp 16 | Ht 59.25 in | Wt 107.0 lb

## 2017-02-25 DIAGNOSIS — Z01419 Encounter for gynecological examination (general) (routine) without abnormal findings: Secondary | ICD-10-CM | POA: Insufficient documentation

## 2017-02-25 DIAGNOSIS — Z113 Encounter for screening for infections with a predominantly sexual mode of transmission: Secondary | ICD-10-CM | POA: Insufficient documentation

## 2017-02-25 MED ORDER — ETONOGESTREL-ETHINYL ESTRADIOL 0.12-0.015 MG/24HR VA RING: 1.0000 | VAGINAL_RING | VAGINAL | 11 refills | Status: DC

## 2017-02-25 NOTE — Patient Instructions (Signed)

## 2017-02-25 NOTE — Progress Notes (Signed)
26 y.o. G0P0000 Single Hispanic female here for annual exam.    Ran out of the NuvaRing.  Happy with the NuvaRing.  Cycles are controlled.   No new partners.   Wants STD testing.   HA controlled with Zomig.   Wants to see a counselor for anger issues.  Denies depression, anxiety, or suicidal ideation.   PCP: Loreen Freud   Patient's last menstrual period was 01/30/2017.           Sexually active: Yes.    The current method of family planning is NuvaRing vaginal inserts.    Exercising: Yes.    Weight lifting Smoker:  no  Health Maintenance: Pap:  03/02/14 Neg  History of abnormal Pap:  no MMG:  None TDaP:  03/2013 Gardasil:   Yes, completed 2015  HIV: 02/09/16 neg Hep C: 02/09/16 neg  Screening Labs: Today    reports that she has quit smoking. She has never used smokeless tobacco. She reports that she drinks about 2.4 oz of alcohol per week . She reports that she does not use drugs.  Past Medical History:  Diagnosis Date  . Hyperlipemia   . Migraines    No aura.  . STD (sexually transmitted disease) 02/09/2016   Chlamydia    Past Surgical History:  Procedure Laterality Date  . NO PAST SURGERIES      Current Outpatient Prescriptions  Medication Sig Dispense Refill  . etonogestrel-ethinyl estradiol (NUVARING) 0.12-0.015 MG/24HR vaginal ring Place 1 each vaginally every 28 (twenty-eight) days. Insert vaginally and leave in place for 3 consecutive weeks, then remove for 1 week. 1 each 0  . triamcinolone cream (KENALOG) 0.1 % daily as needed.    . zolmitriptan (ZOMIG) 5 MG tablet Take 1 tablet (5 mg total) by mouth Once PRN for migraine. May repeat in 2 hours. No more than 2x in one day. 10 tablet 6   No current facility-administered medications for this visit.     Family History  Problem Relation Age of Onset  . Thyroid disease Mother   . Hypertension Maternal Grandmother   . Migraines Maternal Grandfather     ROS:  Pertinent items are noted in HPI.   Otherwise, a comprehensive ROS was negative.  Exam:   BP 102/60 (BP Location: Right Arm, Patient Position: Sitting, Cuff Size: Normal)   Pulse 92   Resp 16   Ht 4' 11.25" (1.505 m)   Wt 107 lb (48.5 kg)   LMP 01/30/2017   BMI 21.43 kg/m     General appearance: alert, cooperative and appears stated age Head: Normocephalic, without obvious abnormality, atraumatic Neck: no adenopathy, supple, symmetrical, trachea midline and thyroid normal to inspection and palpation Lungs: clear to auscultation bilaterally Breasts: normal appearance, no masses or tenderness, No nipple retraction or dimpling, No nipple discharge or bleeding, No axillary or supraclavicular adenopathy Heart: regular rate and rhythm Abdomen: soft, non-tender; no masses, no organomegaly Extremities: extremities normal, atraumatic, no cyanosis or edema Skin: Skin color, texture, turgor normal. No rashes or lesions Lymph nodes: Cervical, supraclavicular, and axillary nodes normal. No abnormal inguinal nodes palpated Neurologic: Grossly normal  Pelvic: External genitalia:  no lesions              Urethra:  normal appearing urethra with no masses, tenderness or lesions              Bartholins and Skenes: normal                 Vagina: normal  appearing vagina with normal color and discharge, no lesions              Cervix: no lesions              Pap taken: Yes.   Bimanual Exam:  Uterus:  normal size, contour, position, consistency, mobility, non-tender              Adnexa: no mass, fullness, tenderness               Chaperone was present for exam.  Assessment:   Well woman visit with normal exam. Hx migraine without aura.  Anger issues.  Elevated LDL cholesterol.   Plan: Mammogram screening discussed. Recommended self breast awareness. Pap and HR HPV as above. Guidelines for Calcium, Vitamin D, regular exercise program including cardiovascular and weight bearing exercise. Refill of NuvaRing x 12 months.  STD  testing.  Information about  Counseling.  Follow up annually and prn.   After visit summary provided.

## 2017-02-26 LAB — HEP, RPR, HIV PANEL
HEP B S AG: NEGATIVE
HIV SCREEN 4TH GENERATION: NONREACTIVE
RPR Ser Ql: NONREACTIVE

## 2017-02-26 LAB — HEPATITIS C ANTIBODY: HEP C VIRUS AB: 0.1 {s_co_ratio} (ref 0.0–0.9)

## 2017-02-27 LAB — CYTOLOGY - PAP
Chlamydia: NEGATIVE
DIAGNOSIS: NEGATIVE
Neisseria Gonorrhea: NEGATIVE
Trichomonas: NEGATIVE

## 2017-02-28 ENCOUNTER — Telehealth: Payer: Self-pay | Admitting: *Deleted

## 2017-02-28 ENCOUNTER — Encounter: Payer: Self-pay | Admitting: Obstetrics and Gynecology

## 2017-02-28 NOTE — Telephone Encounter (Signed)
Spoke with patient. Patient states she should start new NuvaRing tomorrow, but menses has not started, should she place new ring or wait?  Advised patient to start the next NuvaRing with her next menstrual cycle, use condoms and spermicide, as previously recommended by Dr. Edward Jolly -see telephone encounter dated 02/03/17. Advised patient would review with Dr. Edward Jolly and return call with any additional recommendations, patient verbalizes understanding and is agreeable.  Routing to provider for final review. Patient is agreeable to disposition. Will close encounter.    Non-Urgent Medical Question  Message [id number]  From Tara Chang To Patton Salles, MD Sent 02/28/2017 2:38 PM  Hello Dr. Edward Jolly,   As you may recall I was not on Marrion Coy this past month due to my refills expiring. Tomorrow is my day to start a new ring however I did not have a period this month.I'm not sure if I should put one in or wait . Please advise, thank you.

## 2017-02-28 NOTE — Telephone Encounter (Signed)
See telephone encounter dated 02/28/17.

## 2017-04-16 ENCOUNTER — Ambulatory Visit (INDEPENDENT_AMBULATORY_CARE_PROVIDER_SITE_OTHER): Payer: Commercial Managed Care - PPO | Admitting: Psychology

## 2017-04-16 DIAGNOSIS — F411 Generalized anxiety disorder: Secondary | ICD-10-CM | POA: Diagnosis not present

## 2017-05-02 ENCOUNTER — Ambulatory Visit (INDEPENDENT_AMBULATORY_CARE_PROVIDER_SITE_OTHER): Payer: Commercial Managed Care - PPO | Admitting: Psychology

## 2017-05-02 DIAGNOSIS — F411 Generalized anxiety disorder: Secondary | ICD-10-CM | POA: Diagnosis not present

## 2017-05-19 ENCOUNTER — Ambulatory Visit: Payer: Commercial Managed Care - PPO | Admitting: Psychology

## 2017-05-19 DIAGNOSIS — F411 Generalized anxiety disorder: Secondary | ICD-10-CM | POA: Diagnosis not present

## 2017-06-02 ENCOUNTER — Ambulatory Visit: Payer: Commercial Managed Care - PPO | Admitting: Psychology

## 2017-06-02 DIAGNOSIS — F411 Generalized anxiety disorder: Secondary | ICD-10-CM | POA: Diagnosis not present

## 2017-06-18 ENCOUNTER — Ambulatory Visit: Payer: Commercial Managed Care - PPO | Admitting: Psychology

## 2017-07-04 ENCOUNTER — Ambulatory Visit: Payer: Commercial Managed Care - PPO | Admitting: Psychology

## 2017-07-04 DIAGNOSIS — F411 Generalized anxiety disorder: Secondary | ICD-10-CM

## 2017-08-08 ENCOUNTER — Ambulatory Visit: Payer: Commercial Managed Care - PPO | Admitting: Psychology

## 2017-08-08 DIAGNOSIS — F411 Generalized anxiety disorder: Secondary | ICD-10-CM

## 2017-09-12 ENCOUNTER — Ambulatory Visit: Payer: Commercial Managed Care - PPO | Admitting: Psychology

## 2017-09-12 DIAGNOSIS — F411 Generalized anxiety disorder: Secondary | ICD-10-CM | POA: Diagnosis not present

## 2017-10-06 ENCOUNTER — Ambulatory Visit: Payer: Commercial Managed Care - PPO | Admitting: Psychology

## 2017-10-06 DIAGNOSIS — F411 Generalized anxiety disorder: Secondary | ICD-10-CM | POA: Diagnosis not present

## 2017-10-08 ENCOUNTER — Other Ambulatory Visit: Payer: Self-pay | Admitting: Neurology

## 2017-11-12 ENCOUNTER — Ambulatory Visit: Payer: Commercial Managed Care - PPO | Admitting: Psychology

## 2017-11-12 DIAGNOSIS — F411 Generalized anxiety disorder: Secondary | ICD-10-CM | POA: Diagnosis not present

## 2017-12-10 ENCOUNTER — Ambulatory Visit: Payer: Commercial Managed Care - PPO | Admitting: Psychology

## 2017-12-10 DIAGNOSIS — F411 Generalized anxiety disorder: Secondary | ICD-10-CM

## 2018-01-12 ENCOUNTER — Ambulatory Visit: Payer: Commercial Managed Care - PPO | Admitting: Psychology

## 2018-02-03 ENCOUNTER — Ambulatory Visit: Payer: Commercial Managed Care - PPO | Admitting: Psychology

## 2018-02-03 DIAGNOSIS — F411 Generalized anxiety disorder: Secondary | ICD-10-CM

## 2018-03-10 ENCOUNTER — Ambulatory Visit: Payer: Commercial Managed Care - PPO | Admitting: Psychology

## 2018-03-13 ENCOUNTER — Ambulatory Visit: Payer: Commercial Managed Care - PPO | Admitting: Obstetrics and Gynecology

## 2018-03-13 NOTE — Progress Notes (Signed)
27 y.o. G0P0000 Single  female here for annual exam.    Menses normal.   Likes the NuvaRing.  Headaches are much better.  Using essential oils if needed.  Did not like prescription medication.   Saw Dr. Loreta Ave 2 weeks ago for reflux and constipation.  Doing dietary change and starting probiotics.  Did have testing for celiac as well.  Has had follow up of her cholesterol which has decreased.  Does labs at work.   Does want STD testing.   PCP: No PCP     Patient's last menstrual period was 03/11/2018.           Sexually active: No.   Just broke up with her boyfriend of 2 years.  The current method of family planning is NuvaRing vaginal inserts.    Exercising: Yes.    boxing, yoga, weight lifting Smoker:  no  Health Maintenance: Pap:  08/21/218 Negative History of abnormal Pap:  no TDaP:  2014 Gardasil:   Yes 07/19/2013, 03/17/2013, 01/13/2013 HIV and Hep C: 02/25/17 Negative Screening Labs:  Discuss today   reports that she has quit smoking. She has never used smokeless tobacco. She reports that she drinks about 4.0 standard drinks of alcohol per week. She reports that she does not use drugs.  Past Medical History:  Diagnosis Date  . Hyperlipemia   . Migraines    No aura.  . STD (sexually transmitted disease) 02/09/2016   Chlamydia    Past Surgical History:  Procedure Laterality Date  . NO PAST SURGERIES      Current Outpatient Medications  Medication Sig Dispense Refill  . etonogestrel-ethinyl estradiol (NUVARING) 0.12-0.015 MG/24HR vaginal ring Place 1 each vaginally every 28 (twenty-eight) days. Insert vaginally and leave in place for 3 consecutive weeks, then remove for 1 week. 1 each 11  . omeprazole (PRILOSEC) 40 MG capsule Take 40 mg by mouth daily.  4   No current facility-administered medications for this visit.     Family History  Problem Relation Age of Onset  . Thyroid disease Mother   . Hypertension Maternal Grandmother   . Migraines Maternal  Grandfather     Review of Systems  Gastrointestinal: Positive for constipation.       Bloating   All other systems reviewed and are negative.   Exam:   BP 98/62 (BP Location: Right Arm, Patient Position: Sitting, Cuff Size: Normal)   Pulse 72   Resp 16   Ht 4' 11.25" (1.505 m)   Wt 107 lb (48.5 kg)   LMP 03/11/2018   BMI 21.43 kg/m     General appearance: alert, cooperative and appears stated age Head: Normocephalic, without obvious abnormality, atraumatic Neck: no adenopathy, supple, symmetrical, trachea midline and thyroid normal to inspection and palpation Lungs: clear to auscultation bilaterally Breasts: normal appearance, no masses or tenderness, No nipple retraction or dimpling, No nipple discharge or bleeding, No axillary or supraclavicular adenopathy Heart: regular rate and rhythm Abdomen: soft, non-tender; no masses, no organomegaly Extremities: extremities normal, atraumatic, no cyanosis or edema Skin: Skin color, texture, turgor normal. No rashes or lesions Lymph nodes: Cervical, supraclavicular, and axillary nodes normal. No abnormal inguinal nodes palpated Neurologic: Grossly normal  Pelvic: External genitalia:  no lesions              Urethra:  normal appearing urethra with no masses, tenderness or lesions              Bartholins and Skenes: normal  Vagina: normal appearing vagina with normal color and discharge, no lesions              Cervix: no lesions              Pap taken: No. Bimanual Exam:  Uterus:  normal size, contour, position, consistency, mobility, non-tender              Adnexa: no mass, fullness, tenderness            Chaperone was present for exam.  Assessment:   Well woman visit with normal exam. Hx migraine without aura.  Hx elevated LDL cholesterol.  Hx chlamydia.  GERD and constipation.   Seeing GI.   Plan: Mammogram screening. Recommended self breast awareness. Pap and HR HPV as above. Guidelines for Calcium,  Vitamin D, regular exercise program including cardiovascular and weight bearing exercise. STD screening.  Refill of NuvaRing for one year.  Follow up annually and prn.   After visit summary provided.

## 2018-03-16 ENCOUNTER — Other Ambulatory Visit (HOSPITAL_COMMUNITY)
Admission: RE | Admit: 2018-03-16 | Discharge: 2018-03-16 | Disposition: A | Discharge status: Home / Self Care | Payer: Commercial Managed Care - PPO | Source: Ambulatory Visit | Attending: Obstetrics and Gynecology | Admitting: Obstetrics and Gynecology

## 2018-03-16 ENCOUNTER — Encounter: Payer: Self-pay | Admitting: Obstetrics and Gynecology

## 2018-03-16 ENCOUNTER — Ambulatory Visit (INDEPENDENT_AMBULATORY_CARE_PROVIDER_SITE_OTHER): Payer: Commercial Managed Care - PPO | Admitting: Obstetrics and Gynecology

## 2018-03-16 ENCOUNTER — Other Ambulatory Visit: Payer: Self-pay

## 2018-03-16 VITALS — BP 98/62 | HR 72 | Resp 16 | Ht 59.25 in | Wt 107.0 lb

## 2018-03-16 DIAGNOSIS — Z01419 Encounter for gynecological examination (general) (routine) without abnormal findings: Secondary | ICD-10-CM | POA: Insufficient documentation

## 2018-03-16 DIAGNOSIS — Z113 Encounter for screening for infections with a predominantly sexual mode of transmission: Secondary | ICD-10-CM | POA: Insufficient documentation

## 2018-03-16 MED ORDER — ETONOGESTREL-ETHINYL ESTRADIOL 0.12-0.015 MG/24HR VA RING: 1.0000 | VAGINAL_RING | VAGINAL | 11 refills | Status: DC

## 2018-03-16 NOTE — Patient Instructions (Signed)

## 2018-03-17 LAB — HEP, RPR, HIV PANEL
HIV SCREEN 4TH GENERATION: NONREACTIVE
Hepatitis B Surface Ag: NEGATIVE
RPR Ser Ql: NONREACTIVE

## 2018-03-17 LAB — CYTOLOGY - PAP
Chlamydia: NEGATIVE
DIAGNOSIS: NEGATIVE
Neisseria Gonorrhea: NEGATIVE
TRICH (WINDOWPATH): NEGATIVE

## 2018-03-17 LAB — HEPATITIS C ANTIBODY: Hep C Virus Ab: <0.1 s/co ratio (ref 0.0–0.9)

## 2018-06-10 ENCOUNTER — Telehealth: Payer: Self-pay | Admitting: Obstetrics and Gynecology

## 2018-06-10 NOTE — Telephone Encounter (Signed)
Patient called stating that she was supposed to start her cycle on Friday, 06/05/18. Patient stated that she is "always on time." Patient took a UPT yesterday and it was negative.

## 2018-06-10 NOTE — Telephone Encounter (Signed)
Spoke with patient. Patient is concerned menses has not started, was due to start 11/29, menses have always been irregular. UPT negative 12/3. SA, condoms for contraceptive. No longer using nuvaring. Denies any other symptoms. Patient requesting OV. OV scheduled for 12/9 at 2:15pm with Dr. Edward JollySilva. Patient declined OV for today at 11:30. Advised to continue to monitor, return call to office if new symptoms develop or menses starts.   Routing to provider for final review. Patient is agreeable to disposition. Will close encounter.

## 2018-06-15 ENCOUNTER — Ambulatory Visit (INDEPENDENT_AMBULATORY_CARE_PROVIDER_SITE_OTHER): Payer: Commercial Managed Care - PPO | Admitting: Obstetrics and Gynecology

## 2018-06-15 ENCOUNTER — Encounter: Payer: Self-pay | Admitting: Obstetrics and Gynecology

## 2018-06-15 VITALS — BP 116/72 | HR 72 | Temp 98.2°F | Resp 14 | Ht 59.25 in | Wt 105.0 lb

## 2018-06-15 DIAGNOSIS — L659 Nonscarring hair loss, unspecified: Secondary | ICD-10-CM | POA: Diagnosis not present

## 2018-06-15 DIAGNOSIS — R7989 Other specified abnormal findings of blood chemistry: Secondary | ICD-10-CM

## 2018-06-15 DIAGNOSIS — R5381 Other malaise: Secondary | ICD-10-CM

## 2018-06-15 DIAGNOSIS — R638 Other symptoms and signs concerning food and fluid intake: Secondary | ICD-10-CM

## 2018-06-15 DIAGNOSIS — N926 Irregular menstruation, unspecified: Secondary | ICD-10-CM | POA: Diagnosis not present

## 2018-06-15 LAB — POCT URINE PREGNANCY: Preg Test, Ur: NEGATIVE

## 2018-06-15 NOTE — Progress Notes (Signed)
GYNECOLOGY  VISIT   HPI: 27 y.o.   Single  Hispanic  female   G0P0000 with Patient's last menstrual period was 06/15/2018.   here for no menses. Per patient, started her period today after being a week late.  Not feeling well.   Thinks that something is changing or imbalanced. UPT:  Negative.  Hx chlamydia.   Wants to be hormone free as she has been on them for a while.  Stopped the NuvaRing 4 months ago.  She got her period normally with the last 3 months.   Feels better emotionally when she is off her birth control.  When she stopped the Depo Provera in the past, she delayed to have her menstruation.   Emotionally not feeling fine.  Sees a therapist.   Some stress headaches.  No increased hair growth.  Some hair loss.  No nipple discharge.  Saw a gastroenterologist and is having reflux problems.  BMs are becoming more normal. Taking probiotics.   Not with her current partner.   Lost 5 pounds since this summer.   GYNECOLOGIC HISTORY: Patient's last menstrual period was 06/15/2018. Contraception:  Condoms sometimes per patient Menopausal hormone therapy:  n/a Last mammogram:  n/a Last pap smear: 03-16-18 Neg, 02-25-17 Neg        OB History    Gravida  0   Para  0   Term  0   Preterm  0   AB  0   Living  0     SAB  0   TAB  0   Ectopic  0   Multiple  0   Live Births  0              Patient Active Problem List   Diagnosis Date Noted  . Migraine without aura and with status migrainosus, not intractable 03/05/2016  . Left arm numbness 03/05/2016    Past Medical History:  Diagnosis Date  . Hyperlipemia   . Migraines    No aura.  . STD (sexually transmitted disease) 02/09/2016   Chlamydia    Past Surgical History:  Procedure Laterality Date  . NO PAST SURGERIES      No current outpatient medications on file.   No current facility-administered medications for this visit.      ALLERGIES: Patient has no known allergies.  Family  History  Problem Relation Age of Onset  . Thyroid disease Mother   . Hypertension Maternal Grandmother   . Migraines Maternal Grandfather     Social History   Socioeconomic History  . Marital status: Single    Spouse name: Not on file  . Number of children: 0  . Years of education: 34  . Highest education level: Not on file  Occupational History  . Occupation: Nurse, children's  Social Needs  . Financial resource strain: Not on file  . Food insecurity:    Worry: Not on file    Inability: Not on file  . Transportation needs:    Medical: Not on file    Non-medical: Not on file  Tobacco Use  . Smoking status: Former Games developer  . Smokeless tobacco: Never Used  Substance and Sexual Activity  . Alcohol use: Yes    Alcohol/week: 4.0 standard drinks    Types: 4 Standard drinks or equivalent per week    Comment: Socially--3 to 4 drinks/mo  . Drug use: No  . Sexual activity: Yes    Partners: Male    Birth control/protection: Condom  Lifestyle  .  Physical activity:    Days per week: Not on file    Minutes per session: Not on file  . Stress: Not on file  Relationships  . Social connections:    Talks on phone: Not on file    Gets together: Not on file    Attends religious service: Not on file    Active member of club or organization: Not on file    Attends meetings of clubs or organizations: Not on file    Relationship status: Not on file  . Intimate partner violence:    Fear of current or ex partner: Not on file    Emotionally abused: Not on file    Physically abused: Not on file    Forced sexual activity: Not on file  Other Topics Concern  . Not on file  Social History Narrative   Lives with mother    Caffeine use: Drinks coffee (2-3 days per week)   Exercise--  2-3 days a week    Review of Systems  Constitutional:       Craving sweets   Genitourinary: Positive for frequency.       Menstrual cycle changes  Skin:       Hair loss Itching   Neurological: Positive  for headaches.  All other systems reviewed and are negative.   PHYSICAL EXAMINATION:    BP 116/72 (BP Location: Right Arm, Patient Position: Sitting, Cuff Size: Normal)   Pulse 72   Temp 98.2 F (36.8 C) (Oral)   Resp 14   Ht 4' 11.25" (1.505 m)   Wt 105 lb (47.6 kg)   LMP 06/15/2018   BMI 21.03 kg/m     General appearance: alert, cooperative and appears stated age   Pelvic: External genitalia:  no lesions              Urethra:  normal appearing urethra with no masses, tenderness or lesions              Bartholins and Skenes: normal                 Vagina: normal appearing vagina with normal color and discharge, no lesions              Cervix: no lesions.. Menstrual flow.                 Bimanual Exam:  Uterus:  normal size, contour, position, consistency, mobility, non-tender              Adnexa: no mass, fullness, tenderness          Chaperone was present for exam.  ASSESSMENT  Irregular menses.  Hair loss.  Malaise.  Acne.   Craving sweets. Relationship stress.  Declines hormonal contraception.  PLAN  Check TSH, prolactin, CBC, CMP, vit D.  She may consider seeing Dermatology.  She is established.  We discussed contraceptive and noncontraceptive benefits of hormonal birth control.  She declines.  I did mention Paragard if she desires something nonhormonal in the future.    An After Visit Summary was printed and given to the patient.  __25____ minutes face to face time of which over 50% was spent in counseling.

## 2018-06-16 LAB — COMPREHENSIVE METABOLIC PANEL
A/G RATIO: 1.5 (ref 1.2–2.2)
ALT: 11 IU/L (ref 0–32)
AST: 16 IU/L (ref 0–40)
Albumin: 4.4 g/dL (ref 3.5–5.5)
Alkaline Phosphatase: 81 IU/L (ref 39–117)
BUN/Creatinine Ratio: 12 (ref 9–23)
BUN: 9 mg/dL (ref 6–20)
Bilirubin Total: 0.3 mg/dL (ref 0.0–1.2)
CALCIUM: 9.2 mg/dL (ref 8.7–10.2)
CO2: 23 mmol/L (ref 20–29)
Chloride: 103 mmol/L (ref 96–106)
Creatinine, Ser: 0.73 mg/dL (ref 0.57–1.00)
GFR, EST AFRICAN AMERICAN: 131 mL/min/{1.73_m2} (ref >59)
GFR, EST NON AFRICAN AMERICAN: 114 mL/min/{1.73_m2} (ref >59)
Globulin, Total: 2.9 g/dL (ref 1.5–4.5)
Glucose: 91 mg/dL (ref 65–99)
POTASSIUM: 4 mmol/L (ref 3.5–5.2)
Sodium: 138 mmol/L (ref 134–144)
TOTAL PROTEIN: 7.3 g/dL (ref 6.0–8.5)

## 2018-06-16 LAB — PROLACTIN: Prolactin: 17.1 ng/mL (ref 4.8–23.3)

## 2018-06-16 LAB — CBC
HEMATOCRIT: 37.2 % (ref 34.0–46.6)
HEMOGLOBIN: 12.7 g/dL (ref 11.1–15.9)
MCH: 29.7 pg (ref 26.6–33.0)
MCHC: 34.1 g/dL (ref 31.5–35.7)
MCV: 87 fL (ref 79–97)
PLATELETS: 327 10*3/uL (ref 150–450)
RBC: 4.28 x10E6/uL (ref 3.77–5.28)
RDW: 12.5 % (ref 12.3–15.4)
WBC: 6.9 10*3/uL (ref 3.4–10.8)

## 2018-06-16 LAB — VITAMIN D 25 HYDROXY (VIT D DEFICIENCY, FRACTURES): Vit D, 25-Hydroxy: 11.5 ng/mL — ABNORMAL LOW (ref 30.0–100.0)

## 2018-06-16 LAB — TSH: TSH: 2.37 u[IU]/mL (ref 0.450–4.500)

## 2018-06-18 ENCOUNTER — Other Ambulatory Visit: Payer: Self-pay | Admitting: *Deleted

## 2018-06-18 MED ORDER — VITAMIN D (ERGOCALCIFEROL) 1.25 MG (50000 UNIT) PO CAPS: 50000.0000 [IU] | ORAL_CAPSULE | ORAL | 0 refills | Status: DC

## 2018-06-18 NOTE — Addendum Note (Signed)
Addended by: Ardell IsaacsAMUNDSON C SILVA, Debbe BalesBROOK E on: 06/18/2018 02:20 PM   Modules accepted: Orders

## 2018-08-07 ENCOUNTER — Ambulatory Visit: Payer: Commercial Managed Care - PPO | Admitting: Psychology

## 2018-08-07 DIAGNOSIS — F411 Generalized anxiety disorder: Secondary | ICD-10-CM | POA: Diagnosis not present

## 2018-09-09 ENCOUNTER — Ambulatory Visit (INDEPENDENT_AMBULATORY_CARE_PROVIDER_SITE_OTHER): Payer: Commercial Managed Care - PPO | Admitting: Psychology

## 2018-09-09 DIAGNOSIS — F411 Generalized anxiety disorder: Secondary | ICD-10-CM | POA: Diagnosis not present

## 2018-09-23 ENCOUNTER — Telehealth: Payer: Self-pay | Admitting: *Deleted

## 2018-09-23 ENCOUNTER — Other Ambulatory Visit: Payer: Self-pay

## 2018-09-23 ENCOUNTER — Ambulatory Visit (INDEPENDENT_AMBULATORY_CARE_PROVIDER_SITE_OTHER): Payer: Commercial Managed Care - PPO | Admitting: Obstetrics and Gynecology

## 2018-09-23 ENCOUNTER — Encounter: Payer: Self-pay | Admitting: Obstetrics and Gynecology

## 2018-09-23 ENCOUNTER — Other Ambulatory Visit (INDEPENDENT_AMBULATORY_CARE_PROVIDER_SITE_OTHER): Payer: Commercial Managed Care - PPO

## 2018-09-23 VITALS — BP 114/78 | HR 72 | Temp 98.8°F | Resp 14 | Ht 59.25 in | Wt 105.0 lb

## 2018-09-23 DIAGNOSIS — R7989 Other specified abnormal findings of blood chemistry: Secondary | ICD-10-CM | POA: Diagnosis not present

## 2018-09-23 DIAGNOSIS — H539 Unspecified visual disturbance: Secondary | ICD-10-CM

## 2018-09-23 DIAGNOSIS — R42 Dizziness and giddiness: Secondary | ICD-10-CM

## 2018-09-23 LAB — POCT URINE PREGNANCY: Preg Test, Ur: NEGATIVE

## 2018-09-23 NOTE — Progress Notes (Signed)
Patient scheduled while in office to establish care and for further evaluation of dizziness and visual changes. Scheduled with Dr. Curly Shores at [facility] on 09/24/18 at 1030. Patient verbalizes understanding and is agreeable.

## 2018-09-23 NOTE — Progress Notes (Signed)
GYNECOLOGY  VISIT   HPI: 28 y.o.   Single  Hispanic  female   G0P0000 with Patient's last menstrual period was 09/18/2018.   here for dizziness and blurred vision.  She was scheduled for a lab visit to recheck her low vit D level and commented to the staff about her above symptoms.   Patient states that she took a Plan B pill at the end of February around the 22nd.  Occasional tenderness in the lower abdomen.  States she is having visual change along with her dizziness.  Visual change for 3 - 4 weeks.  Lightheaded for 2 months.  No headaches.   No cough, fever, sore throat.   Was drinking one coffee or black tea per day.  Now switched to herbal tea.   States she occasional feels a shock in her ovarian region.  No pain today.   Taking a probiotic. Better bowel function.  Gastric reflux is much better.   UPT: negative  GYNECOLOGIC HISTORY: Patient's last menstrual period was 09/18/2018. Contraception:  Condoms all of the time per patient Menopausal hormone therapy:  n/a Last mammogram:  n/a Last pap smear:   03/16/18 Neg, 02/25/17 Neg        OB History    Gravida  0   Para  0   Term  0   Preterm  0   AB  0   Living  0     SAB  0   TAB  0   Ectopic  0   Multiple  0   Live Births  0              Patient Active Problem List   Diagnosis Date Noted  . Migraine without aura and with status migrainosus, not intractable 03/05/2016    Past Medical History:  Diagnosis Date  . Hyperlipemia   . Migraines    No aura.  . STD (sexually transmitted disease) 02/09/2016   Chlamydia    Past Surgical History:  Procedure Laterality Date  . NO PAST SURGERIES      Current Outpatient Medications  Medication Sig Dispense Refill  . Adapalene-Benzoyl Peroxide (EPIDUO FORTE) 0.3-2.5 % GEL Apply 1 application topically at bedtime. 60 g 1  . meclizine (ANTIVERT) 25 MG tablet Take 1 tablet (25 mg total) by mouth 3 (three) times daily as needed for dizziness. 30  tablet 0  . omeprazole (PRILOSEC) 40 MG capsule as needed.    . Vitamin D, Ergocalciferol, (DRISDOL) 1.25 MG (50000 UT) CAPS capsule Take 1 capsule (50,000 Units total) by mouth every 7 (seven) days. 12 capsule 0   No current facility-administered medications for this visit.      ALLERGIES: Patient has no known allergies.  Family History  Problem Relation Age of Onset  . Thyroid disease Mother   . Hypertension Maternal Grandmother   . Migraines Maternal Grandfather     Social History   Socioeconomic History  . Marital status: Single    Spouse name: Not on file  . Number of children: 0  . Years of education: 20  . Highest education level: Not on file  Occupational History  . Occupation: Nurse, children's  Social Needs  . Financial resource strain: Not on file  . Food insecurity:    Worry: Not on file    Inability: Not on file  . Transportation needs:    Medical: Not on file    Non-medical: Not on file  Tobacco Use  . Smoking status: Former  Smoker  . Smokeless tobacco: Never Used  Substance and Sexual Activity  . Alcohol use: Yes    Alcohol/week: 4.0 standard drinks    Types: 4 Standard drinks or equivalent per week    Comment: Socially--3 to 4 drinks/mo  . Drug use: No  . Sexual activity: Yes    Partners: Male    Birth control/protection: Condom  Lifestyle  . Physical activity:    Days per week: Not on file    Minutes per session: Not on file  . Stress: Not on file  Relationships  . Social connections:    Talks on phone: Not on file    Gets together: Not on file    Attends religious service: Not on file    Active member of club or organization: Not on file    Attends meetings of clubs or organizations: Not on file    Relationship status: Not on file  . Intimate partner violence:    Fear of current or ex partner: Not on file    Emotionally abused: Not on file    Physically abused: Not on file    Forced sexual activity: Not on file  Other Topics Concern  .  Not on file  Social History Narrative   Lives with mother    Caffeine use: Drinks coffee (2-3 days per week)   Exercise--  2-3 days a week    Review of Systems  Constitutional: Negative.   HENT: Negative.   Eyes: Positive for visual disturbance.  Respiratory: Negative.   Cardiovascular: Negative.   Gastrointestinal: Negative.   Endocrine: Negative.   Genitourinary: Negative.   Musculoskeletal: Negative.   Skin: Negative.   Allergic/Immunologic: Negative.   Neurological: Positive for dizziness.  Hematological: Negative.   Psychiatric/Behavioral: Negative.     PHYSICAL EXAMINATION:    BP 114/78 (BP Location: Left Arm, Patient Position: Sitting, Cuff Size: Normal)   Pulse 72   Temp 98.8 F (37.1 C) (Oral)   Resp 14   Ht 4' 11.25" (1.505 m)   Wt 105 lb (47.6 kg)   LMP 09/18/2018   BMI 21.03 kg/m     General appearance: alert, cooperative and appears stated age Head: Normocephalic, without obvious abnormality, atraumatic Lungs: clear to auscultation bilaterally Heart: regular rate and rhythm Abdomen: soft, non-tender, no masses,  no organomegaly Extremities: extremities normal, atraumatic, no cyanosis or edema No abnormal inguinal nodes palpated Neurologic: Grossly normal  Pelvic: External genitalia:  no lesions              Urethra:  normal appearing urethra with no masses, tenderness or lesions              Bartholins and Skenes: normal                 Vagina: normal appearing vagina with normal color and discharge, no lesions              Cervix: no lesions                Bimanual Exam:  Uterus:  normal size, contour, position, consistency, mobility, non-tender              Adnexa: no mass, fullness, tenderness             Chaperone was present for exam.  ASSESSMENT  Dizziness.  Visual change.  Vertigo? Hx low vit D. Recent use of Plan B.  PLAN  Will check vit D, CBC, CMP, qual hCG, TSH.  Refer to  PCP.    An After Visit Summary was printed and given  to the patient.  __15____ minutes face to face time of which over 50% was spent in counseling.

## 2018-09-23 NOTE — Telephone Encounter (Signed)
Patient in office for 3 mo repeat Vit D. Request to speak with nurse. Patient reports LMP 09/18/18, cycles vary in length, has monthly menses. SA, condoms for contraceptive. Reports dizziness, lightheadedness and blurred vision for 2 months. Vision changes mostly in left eye, occasional in right eye for the past 3 wks. Patient states she thought she was here for OV and labs.   Reviewed with Dr. Edward Jolly. OV scheduled for today at 1545. Patient verbalizes understanding and is agreeable.  Encounter closed.

## 2018-09-24 ENCOUNTER — Encounter: Payer: Self-pay | Admitting: Family Medicine

## 2018-09-24 ENCOUNTER — Other Ambulatory Visit: Payer: Self-pay

## 2018-09-24 ENCOUNTER — Ambulatory Visit (INDEPENDENT_AMBULATORY_CARE_PROVIDER_SITE_OTHER): Payer: Commercial Managed Care - PPO | Admitting: Family Medicine

## 2018-09-24 VITALS — BP 98/64 | HR 79 | Temp 98.4°F | Ht 59.25 in | Wt 105.2 lb

## 2018-09-24 DIAGNOSIS — H538 Other visual disturbances: Secondary | ICD-10-CM | POA: Diagnosis not present

## 2018-09-24 DIAGNOSIS — R42 Dizziness and giddiness: Secondary | ICD-10-CM | POA: Diagnosis not present

## 2018-09-24 LAB — CBC
HEMATOCRIT: 37.9 % (ref 34.0–46.6)
Hemoglobin: 12.6 g/dL (ref 11.1–15.9)
MCH: 30.4 pg (ref 26.6–33.0)
MCHC: 33.2 g/dL (ref 31.5–35.7)
MCV: 92 fL (ref 79–97)
Platelets: 287 10*3/uL (ref 150–450)
RBC: 4.14 x10E6/uL (ref 3.77–5.28)
RDW: 12.9 % (ref 11.7–15.4)
WBC: 5.7 10*3/uL (ref 3.4–10.8)

## 2018-09-24 LAB — COMPREHENSIVE METABOLIC PANEL
ALBUMIN: 4.2 g/dL (ref 3.9–5.0)
ALT: 16 IU/L (ref 0–32)
AST: 22 IU/L (ref 0–40)
Albumin/Globulin Ratio: 1.5 (ref 1.2–2.2)
Alkaline Phosphatase: 76 IU/L (ref 39–117)
BUN/Creatinine Ratio: 16 (ref 9–23)
BUN: 12 mg/dL (ref 6–20)
Bilirubin Total: 0.3 mg/dL (ref 0.0–1.2)
CO2: 22 mmol/L (ref 20–29)
Calcium: 9.5 mg/dL (ref 8.7–10.2)
Chloride: 104 mmol/L (ref 96–106)
Creatinine, Ser: 0.75 mg/dL (ref 0.57–1.00)
GFR calc Af Amer: 127 mL/min/{1.73_m2} (ref >59)
GFR calc non Af Amer: 110 mL/min/{1.73_m2} (ref >59)
Globulin, Total: 2.8 g/dL (ref 1.5–4.5)
Glucose: 92 mg/dL (ref 65–99)
Potassium: 4.2 mmol/L (ref 3.5–5.2)
Sodium: 141 mmol/L (ref 134–144)
Total Protein: 7 g/dL (ref 6.0–8.5)

## 2018-09-24 LAB — HCG, SERUM, QUALITATIVE: hCG,Beta Subunit,Qual,Serum: NEGATIVE m[IU]/mL (ref <6)

## 2018-09-24 LAB — TSH: TSH: 1.29 u[IU]/mL (ref 0.450–4.500)

## 2018-09-24 LAB — VITAMIN B12: Vitamin B-12: 528 pg/mL (ref 211–911)

## 2018-09-24 LAB — VITAMIN D 25 HYDROXY (VIT D DEFICIENCY, FRACTURES): Vit D, 25-Hydroxy: 46.8 ng/mL (ref 30.0–100.0)

## 2018-09-24 MED ORDER — ADAPALENE-BENZOYL PEROXIDE 0.3-2.5 % EX GEL: 1.0000 "application " | Freq: Every day | CUTANEOUS | 1 refills | Status: DC

## 2018-09-24 MED ORDER — MECLIZINE HCL 25 MG PO TABS: 25.0000 mg | ORAL_TABLET | Freq: Three times a day (TID) | ORAL | 0 refills | Status: DC | PRN

## 2018-09-24 NOTE — Patient Instructions (Addendum)
Exercises three times a day for 1-2 weeks Meclizine prn for dizziness.  Get eyes checked   If not better in 2 weeks, will need to scan you head! email me!   Benign Positional Vertigo Vertigo is the feeling that you or your surroundings are moving when they are not. Benign positional vertigo is the most common form of vertigo. This is usually a harmless condition (benign). This condition is positional. This means that symptoms are triggered by certain movements and positions. This condition can be dangerous if it occurs while you are doing something that could cause harm to you or others. This includes activities such as driving or operating machinery. What are the causes? In many cases, the cause of this condition is not known. It may be caused by a disturbance in an area of the inner ear that helps your brain to sense movement and balance. This disturbance can be caused by:  Viral infection (labyrinthitis).  Head injury.  Repetitive motion, such as jumping, dancing, or running. What increases the risk? You are more likely to develop this condition if:  You are a woman.  You are 14 years of age or older. What are the signs or symptoms? Symptoms of this condition usually happen when you move your head or your eyes in different directions. Symptoms may start suddenly, and usually last for less than a minute. They include:  Loss of balance and falling.  Feeling like you are spinning or moving.  Feeling like your surroundings are spinning or moving.  Nausea and vomiting.  Blurred vision.  Dizziness.  Involuntary eye movement (nystagmus). Symptoms can be mild and cause only minor problems, or they can be severe and interfere with daily life. Episodes of benign positional vertigo may return (recur) over time. Symptoms may improve over time. How is this diagnosed? This condition may be diagnosed based on:  Your medical history.  Physical exam of the head, neck, and ears.   Tests, such as: ? MRI. ? CT scan. ? Eye movement tests. Your health care provider may ask you to change positions quickly while he or she watches you for symptoms of benign positional vertigo, such as nystagmus. Eye movement may be tested with a variety of exams that are designed to evaluate or stimulate vertigo. ? An electroencephalogram (EEG). This records electrical activity in your brain. ? Hearing tests. You may be referred to a health care provider who specializes in ear, nose, and throat (ENT) problems (otolaryngologist) or a provider who specializes in disorders of the nervous system (neurologist). How is this treated?  This condition may be treated in a session in which your health care provider moves your head in specific positions to adjust your inner ear back to normal. Treatment for this condition may take several sessions. Surgery may be needed in severe cases, but this is rare. In some cases, benign positional vertigo may resolve on its own in 2-4 weeks. Follow these instructions at home: Safety  Move slowly. Avoid sudden body or head movements or certain positions, as told by your health care provider.  Avoid driving until your health care provider says it is safe for you to do so.  Avoid operating heavy machinery until your health care provider says it is safe for you to do so.  Avoid doing any tasks that would be dangerous to you or others if vertigo occurs.  If you have trouble walking or keeping your balance, try using a cane for stability. If you feel dizzy or  unstable, sit down right away.  Return to your normal activities as told by your health care provider. Ask your health care provider what activities are safe for you. General instructions  Take over-the-counter and prescription medicines only as told by your health care provider.  Drink enough fluid to keep your urine pale yellow.  Keep all follow-up visits as told by your health care provider. This is  important. Contact a health care provider if:  You have a fever.  Your condition gets worse or you develop new symptoms.  Your family or friends notice any behavioral changes.  You have nausea or vomiting that gets worse.  You have numbness or a "pins and needles" sensation. Get help right away if you:  Have difficulty speaking or moving.  Are always dizzy.  Faint.  Develop severe headaches.  Have weakness in your legs or arms.  Have changes in your hearing or vision.  Develop a stiff neck.  Develop sensitivity to light. Summary  Vertigo is the feeling that you or your surroundings are moving when they are not. Benign positional vertigo is the most common form of vertigo.  The cause of this condition is not known. It may be caused by a disturbance in an area of the inner ear that helps your brain to sense movement and balance.  Symptoms include loss of balance and falling, feeling that you or your surroundings are moving, nausea and vomiting, and blurred vision.  This condition can be diagnosed based on symptoms, physical exam, and other tests, such as MRI, CT scan, eye movement tests, and hearing tests.  Follow safety instructions as told by your health care provider. You will also be told when to contact your health care provider in case of problems. This information is not intended to replace advice given to you by your health care provider. Make sure you discuss any questions you have with your health care provider. Document Released: 04/01/2006 Document Revised: 12/03/2017 Document Reviewed: 12/03/2017 Elsevier Interactive Patient Education  2019 [employer].

## 2018-09-24 NOTE — Progress Notes (Signed)
Patient: Tara Chang MRN: [id number] DOB: 02/01/1991 PCP: Orland Mustard, MD     Subjective:  Chief Complaint  Patient presents with  . Establish Care  . Blurred Vision  . Dizziness x 2 mos    HPI: The patient is a 28 y.o. female who presents today for establishing care. She has had dizziness x 2 months and some blurry vision. Seen at her ob/gyn yesterday. Labs done and included cbc,cmp, tsh, vitamin D and pregnancy. All wnl.   Dizziness: Started 2 months ago, but has progressively gotten worse, especially the last week. She states it didn't happen that often and would go away in a second. Now, she will get blurriness in one of her eyes, normally her left eye, then the dizziness will happen. She states the light headedness/dizziness will usually only last a few seconds then go away. The blurriness only lasts a few second as well. No headaches ever come after these episode and no vision changes. She has never had her eyes checked. Never had glasses/contacts. She denies any precipitating factor. Nothing makes it better/worse. It will happen randomly. She denies any weight loss and is eating 4 meals a day. No palpitations, chest pain, ringing in her ears, hearing loss. She never feels off balance. In the past head movement did make dizziness worse, but this has not happened in a few weeks.   She denies any other neurological disease in her family. No autoimmune/MS.  She denies any other neurological symptoms, rash, joint pain, etc.    FH of asthma in her grandmother. NO first degree relative with colon or breast cancer. Does not know her dad's side of family.   Review of Systems  Constitutional: Positive for fatigue. Negative for chills and fever.  HENT: Negative for dental problem, ear pain, hearing loss and trouble swallowing.   Eyes: Positive for visual disturbance.       Has noticed blurriness w/her vision x approx 3 wks  Respiratory: Negative for cough, chest tightness and  shortness of breath.   Cardiovascular: Negative for chest pain, palpitations and leg swelling.  Gastrointestinal: Negative for abdominal pain, blood in stool, diarrhea and nausea.  Endocrine: Negative for cold intolerance, polydipsia, polyphagia and polyuria.  Genitourinary: Negative.  Negative for dysuria and hematuria.  Musculoskeletal: Positive for back pain. Negative for arthralgias and neck pain.       C/o intermittent lower back pain.  Skin: Negative.  Negative for rash.  Neurological: Positive for dizziness. Negative for headaches.       Dizziness on and off x 2 mos.  Currently not dizzy at visit today.    Psychiatric/Behavioral: Negative for dysphoric mood and sleep disturbance. The patient is not nervous/anxious.     Allergies Patient has No Known Allergies.  Past Medical History Patient  has a past medical history of Hyperlipemia, Migraines, and STD (sexually transmitted disease) (02/09/2016).  Surgical History Patient  has a past surgical history that includes No past surgeries.  Family History Pateint's family history includes Hypertension in her maternal grandmother; Migraines in her maternal grandfather; Thyroid disease in her mother.  Social History Patient  reports that she has quit smoking. She has never used smokeless tobacco. She reports current alcohol use of about 4.0 standard drinks of alcohol per week. She reports that she does not use drugs.    Objective: Vitals:   09/24/18 1046  BP: 98/64  Pulse: 79  Temp: 98.4 F (36.9 C)  TempSrc: Oral  SpO2: 98%  Weight: 105 lb  3.2 oz (47.7 kg)  Height: 4' 11.25" (1.505 m)    Body mass index is 21.07 kg/m.  Physical Exam Vitals signs reviewed.  Constitutional:      Appearance: Normal appearance. She is normal weight.  HENT:     Right Ear: Tympanic membrane, ear canal and external ear normal.     Left Ear: Tympanic membrane, ear canal and external ear normal.     Nose: Nose normal.     Mouth/Throat:      Mouth: Mucous membranes are moist.  Eyes:     Extraocular Movements: Extraocular movements intact.     Pupils: Pupils are equal, round, and reactive to light.  Neck:     Musculoskeletal: Normal range of motion and neck supple.  Cardiovascular:     Rate and Rhythm: Normal rate and regular rhythm.     Heart sounds: Normal heart sounds.  Pulmonary:     Effort: Pulmonary effort is normal.     Breath sounds: Normal breath sounds.  Abdominal:     General: Abdomen is flat. Bowel sounds are normal.     Palpations: Abdomen is soft.  Musculoskeletal: Normal range of motion.        General: No swelling or tenderness.  Skin:    General: Skin is warm and dry.     Capillary Refill: Capillary refill takes less than 2 seconds.  Neurological:     General: No focal deficit present.     Mental Status: She is alert and oriented to person, place, and time.     Cranial Nerves: No cranial nerve deficit.     Sensory: No sensory deficit.     Deep Tendon Reflexes: Reflexes normal.     Comments: dix halpike weakly positive bilaterally   Psychiatric:        Mood and Affect: Mood normal.        Behavior: Behavior normal.        Assessment/plan: 1. Dizziness All labs from gyn normal. Went over with her. Weakly positive dix halpike. Will treat for BPPV, but do not like her blurry vision before these episodes. Handout given for home exercises to do and meclizine prn. If not better in 2 weeks, I want to image her head.  - Vitamin B12 - ANA  2. Blurry vision, bilateral Checking a few labs and want her to get her eyes checked. Discussed this is not normal, so if eye exam normal and dizziness/blurry vision persists, will need to image her head. She will let me know. Again, with COVID-19 not sure how soon I can get her in for MRI, but will try.  - Vitamin B12 - ANA     Return in about 3 months (around 12/25/2018).   Orland Mustard, MD [facility]     09/24/2018

## 2018-09-25 ENCOUNTER — Telehealth: Payer: Self-pay | Admitting: Obstetrics and Gynecology

## 2018-09-25 ENCOUNTER — Encounter: Payer: Self-pay | Admitting: Obstetrics and Gynecology

## 2018-09-25 NOTE — Telephone Encounter (Signed)
Message left to return call to [place] at [phone].  Recent office visit, negative qual pregnancy testing.  Plan B at the end of February.

## 2018-09-25 NOTE — Telephone Encounter (Signed)
Patient sent the following correspondence through MyChart. Routing to triage to assist patient with request.  Message   Good afternoon Dr. Edward Jolly.    On my visit this Wednesday I noted that my last menstrual cycle was March 13th however after looking at my calendar it was March 1st. That being said i started another cycle this morning. This is the forth month my cycle was been abnormal. This is something i should be concerned about?    Thank you for your time and attention.     -Tara C.   Last seen: 09/23/18

## 2018-09-25 NOTE — Telephone Encounter (Signed)
Spoke with patient regarding irregular cycles.   She has recently seen Dr. Edward Jolly.  Wants to know if irregular cycles are "normal".  Advised blood work and evaluation from Dr. Edward Jolly was normal.  Could have hormone fluctuation after plan B and withdrawal bleeding, then a cycle.  She declines concerns regarding STD.  She declines contraception.   She is advised to continue to monitor cycles and call back with any heavy bleeding, discussed bleeding precautions and emergency symptoms. Agreeable to plan. Encounter to Dr. Edward Jolly to review.

## 2018-09-25 NOTE — Telephone Encounter (Signed)
Patient is returning call to Tracy. °

## 2018-09-26 LAB — ANA: Anti Nuclear Antibody (ANA): NEGATIVE

## 2018-09-26 NOTE — Telephone Encounter (Signed)
I agree with your recommendations.  Encounter closed. 

## 2018-09-29 ENCOUNTER — Other Ambulatory Visit: Payer: Self-pay

## 2018-09-29 MED ORDER — ADAPALENE-BENZOYL PEROXIDE 0.3-2.5 % EX GEL: 1.0000 "application " | Freq: Every day | CUTANEOUS | 1 refills | Status: DC

## 2018-10-15 ENCOUNTER — Ambulatory Visit: Payer: Commercial Managed Care - PPO | Admitting: Psychology

## 2018-11-18 ENCOUNTER — Ambulatory Visit (INDEPENDENT_AMBULATORY_CARE_PROVIDER_SITE_OTHER): Payer: Commercial Managed Care - PPO | Admitting: Psychology

## 2018-11-18 DIAGNOSIS — F411 Generalized anxiety disorder: Secondary | ICD-10-CM | POA: Diagnosis not present

## 2018-12-02 ENCOUNTER — Ambulatory Visit: Payer: Commercial Managed Care - PPO | Admitting: Psychology

## 2018-12-08 ENCOUNTER — Ambulatory Visit (INDEPENDENT_AMBULATORY_CARE_PROVIDER_SITE_OTHER): Payer: Commercial Managed Care - PPO | Admitting: Psychology

## 2018-12-08 DIAGNOSIS — F411 Generalized anxiety disorder: Secondary | ICD-10-CM

## 2018-12-15 ENCOUNTER — Telehealth: Payer: Self-pay | Admitting: Obstetrics and Gynecology

## 2018-12-15 MED ORDER — LEVONORGESTREL 1.5 MG PO TABS: 1.5000 mg | ORAL_TABLET | Freq: Once | ORAL | 0 refills | Status: AC

## 2018-12-15 NOTE — Telephone Encounter (Signed)
Call returned to patient, notified of RX. Patient verbalizes understanding and is agreeable.   Encounter closed.

## 2018-12-15 NOTE — Telephone Encounter (Signed)
[place] for Plan B one Step. Sig:  Take one, po x 1 within 72 hours of un protected intercourse.  Disp:  1, RF:  None.

## 2018-12-15 NOTE — Telephone Encounter (Signed)
Patient would like a prescription called in for Plan B. States she would like to use a coupon and a prescription is required. Pharmacy is [facility] [place].

## 2018-12-15 NOTE — Telephone Encounter (Signed)
Spoke with patient. Patient states she had unprotected intercourse on 12/14/18, requesting Rx for Plan B. Patient would like to use a coupon, needs RX. LMP 12/05/18, no contraceptive. Denies any GYN symptoms or concerns. Pharmacy updated. Advised I will have to review with Lorin Picket and return call, patient agreeable.   Dr. Quincy Simmonds -please advise on Rx.

## 2018-12-25 ENCOUNTER — Ambulatory Visit: Payer: Commercial Managed Care - PPO | Admitting: Family Medicine

## 2019-01-06 ENCOUNTER — Ambulatory Visit (INDEPENDENT_AMBULATORY_CARE_PROVIDER_SITE_OTHER): Payer: Commercial Managed Care - PPO | Admitting: Psychology

## 2019-01-06 DIAGNOSIS — F411 Generalized anxiety disorder: Secondary | ICD-10-CM | POA: Diagnosis not present

## 2019-02-01 ENCOUNTER — Ambulatory Visit (INDEPENDENT_AMBULATORY_CARE_PROVIDER_SITE_OTHER): Payer: Commercial Managed Care - PPO | Admitting: Psychology

## 2019-02-01 DIAGNOSIS — F411 Generalized anxiety disorder: Secondary | ICD-10-CM | POA: Diagnosis not present

## 2019-03-17 ENCOUNTER — Ambulatory Visit (INDEPENDENT_AMBULATORY_CARE_PROVIDER_SITE_OTHER): Payer: Commercial Managed Care - PPO | Admitting: Psychology

## 2019-03-17 DIAGNOSIS — F411 Generalized anxiety disorder: Secondary | ICD-10-CM | POA: Diagnosis not present

## 2019-03-18 ENCOUNTER — Telehealth: Payer: Self-pay | Admitting: Family Medicine

## 2019-03-18 NOTE — Telephone Encounter (Signed)
I contacted the patient to get more information. After speaking to her she said the she no longer had the [employer] and that she only had the [employer]. I updated the insurance. She also inquired about the labs that were done on the bill. I asked if she could read the labs listed however, she did not have the bill any longer. I asked Brittney to help me look into the clinical portion about the labs and she saw that we only did the vitamin B and the ANA. [facility] did labs on 3/18 so those labs may have been reflected on the bill also.  I told her I emailed charge corrections to resubmit the claim to [facility] and would follow up once complete.   No need to route note. For documentation purposes only  Copied from [place] [id number]. Topic: General - Other >> Mar 18, 2019 12:45 PM Leward Quan A wrote: Reason for CRM: Patient called to say that she received a bill for lab charges and on the bill it does not reflect her correct insurance so she is asking for a call back from someone that can help her in getting this billing issue resolved so it does not affect her credit. Please call patient at Ph# [phone]

## 2019-03-23 NOTE — Telephone Encounter (Signed)
I contacted the patient after receiving an email from charge corrections that stated:  "There are no outstanding balances for the patient.   They would need to contact the customer service number [phone]" for further questions.  Patient stated she understood and would contact them. No further action required from me as I unfortunately can not assist any further than providing the number above.   No need to route note, for documentation purposes only.

## 2019-03-25 ENCOUNTER — Ambulatory Visit: Payer: Commercial Managed Care - PPO | Admitting: Family Medicine

## 2019-03-26 ENCOUNTER — Ambulatory Visit: Payer: Commercial Managed Care - PPO | Admitting: Obstetrics and Gynecology

## 2019-04-28 ENCOUNTER — Ambulatory Visit: Payer: Commercial Managed Care - PPO | Admitting: Psychology

## 2019-05-05 ENCOUNTER — Encounter

## 2019-05-05 ENCOUNTER — Ambulatory Visit: Payer: Commercial Managed Care - PPO | Admitting: Obstetrics and Gynecology

## 2019-05-10 ENCOUNTER — Other Ambulatory Visit: Payer: Self-pay

## 2019-05-12 ENCOUNTER — Encounter: Payer: Self-pay | Admitting: Obstetrics and Gynecology

## 2019-05-12 ENCOUNTER — Ambulatory Visit (INDEPENDENT_AMBULATORY_CARE_PROVIDER_SITE_OTHER): Payer: Commercial Managed Care - PPO | Admitting: Obstetrics and Gynecology

## 2019-05-12 ENCOUNTER — Other Ambulatory Visit: Payer: Self-pay

## 2019-05-12 ENCOUNTER — Other Ambulatory Visit (HOSPITAL_COMMUNITY)
Admission: RE | Admit: 2019-05-12 | Discharge: 2019-05-12 | Disposition: A | Discharge status: Home / Self Care | Payer: Commercial Managed Care - PPO | Source: Ambulatory Visit | Attending: Obstetrics and Gynecology | Admitting: Obstetrics and Gynecology

## 2019-05-12 VITALS — BP 110/66 | HR 80 | Temp 98.2°F | Resp 20 | Ht 59.0 in | Wt 104.2 lb

## 2019-05-12 DIAGNOSIS — R82998 Other abnormal findings in urine: Secondary | ICD-10-CM

## 2019-05-12 DIAGNOSIS — R110 Nausea: Secondary | ICD-10-CM | POA: Diagnosis not present

## 2019-05-12 DIAGNOSIS — Z113 Encounter for screening for infections with a predominantly sexual mode of transmission: Secondary | ICD-10-CM

## 2019-05-12 DIAGNOSIS — Z01419 Encounter for gynecological examination (general) (routine) without abnormal findings: Secondary | ICD-10-CM | POA: Diagnosis not present

## 2019-05-12 LAB — POCT URINALYSIS DIPSTICK
Bilirubin, UA: NEGATIVE
Blood, UA: NEGATIVE
Glucose, UA: NEGATIVE
Ketones, UA: NEGATIVE
Leukocytes, UA: NEGATIVE
Nitrite, UA: NEGATIVE
Protein, UA: NEGATIVE
Urobilinogen, UA: 0.2 E.U./dL
pH, UA: 7 (ref 5.0–8.0)

## 2019-05-12 LAB — POCT URINE PREGNANCY: Preg Test, Ur: NEGATIVE

## 2019-05-12 MED ORDER — DROSPIRENONE-ETHINYL ESTRADIOL 3-0.02 MG PO TABS: 1.0000 | ORAL_TABLET | Freq: Every day | ORAL | 11 refills | Status: DC

## 2019-05-12 NOTE — Progress Notes (Signed)
28 y.o. G0P0000 Single Hispanic female here for annual exam.    Patient complaining of urine being "pinkage to red", but she has been drinking beet juice. No urinary symptoms.   Patient has had nausea x 2 weeks following shrimp and alcohol intake.  Menses are still 4 - 5 weeks.  More cramping now.  She wants to go back on birth control pills, specifically what she took in the past for her skin.   Requesting general labs. Gave up red meat.   Taking MVI, probiotic, vit A, and Vit B12.   Urine Dip: Normal. UPT:  Negative.  PCP:  Orland Mustard, MD   Patient's last menstrual period was 04/25/2019 (exact date).           Sexually active: Yes.    The current method of family planning is condoms most of the time.    Exercising: Yes.    boxing classes. Smoker:  no  Health Maintenance: Pap:  03-16-18 Neg, 08/21/218 Neg, 03-02-14 Neg History of abnormal Pap:  no MMG:  n/a Colonoscopy:  n/a BMD:   n/a  Result  n/a TDaP:  2014 Gardasil:   Yes, completed HIV:03-16-18 NR Hep C:03-16-18 Neg Screening Labs:  Today. Flu vaccine:  Recommended.    reports that she has never smoked. She has never used smokeless tobacco. She reports current alcohol use of about 2.0 standard drinks of alcohol per week. She reports that she does not use drugs.  Past Medical History:  Diagnosis Date  . Frequent headaches   . GERD (gastroesophageal reflux disease)   . Hyperlipemia   . Migraines    No aura.  . STD (sexually transmitted disease) 02/09/2016   Chlamydia    Past Surgical History:  Procedure Laterality Date  . NO PAST SURGERIES      Current Outpatient Medications  Medication Sig Dispense Refill  . Adapalene-Benzoyl Peroxide (EPIDUO FORTE) 0.3-2.5 % GEL Apply 1 application topically at bedtime. 60 g 1  . Dapsone 5 % topical gel Apply 1 application topically every other day.    . ketoconazole (NIZORAL) 2 % shampoo     . omeprazole (PRILOSEC) 40 MG capsule as needed.     No current  facility-administered medications for this visit.     Family History  Problem Relation Age of Onset  . Thyroid disease Mother   . Birth defects Mother   . Depression Mother   . Hypertension Maternal Grandmother   . Asthma Maternal Grandmother   . Migraines Maternal Grandfather   . Alcohol abuse Maternal Grandfather     Review of Systems  All other systems reviewed and are negative.   Exam:   BP 110/66   Pulse 80   Temp 98.2 F (36.8 C) (Temporal)   Resp 20   Ht 4\' 11"  (1.499 m)   Wt 104 lb 3.2 oz (47.3 kg)   LMP 04/25/2019 (Exact Date)   BMI 21.05 kg/m     General appearance: alert, cooperative and appears stated age Head: normocephalic, without obvious abnormality, atraumatic Neck: no adenopathy, supple, symmetrical, trachea midline and thyroid normal to inspection and palpation Lungs: clear to auscultation bilaterally Breasts: normal appearance, no masses or tenderness, No nipple retraction or dimpling, No nipple discharge or bleeding, No axillary adenopathy Heart: regular rate and rhythm Abdomen: soft, non-tender; no masses, no organomegaly Extremities: extremities normal, atraumatic, no cyanosis or edema Skin: skin color, texture, turgor normal. No rashes or lesions Lymph nodes: cervical, supraclavicular, and axillary nodes normal. Neurologic: grossly normal  Pelvic: External genitalia:  no lesions              No abnormal inguinal nodes palpated.              Urethra:  normal appearing urethra with no masses, tenderness or lesions              Bartholins and Skenes: normal                 Vagina: normal appearing vagina with normal color and discharge, no lesions              Cervix: no lesions              Pap taken: No. Bimanual Exam:  Uterus:  normal size, contour, position, consistency, mobility, non-tender              Adnexa: no mass, fullness, tenderness                Chaperone was present for exam.  Assessment:   Well woman visit with normal  exam. Hx migraine without aura.  Hx elevated LDL cholesterol. Hx chlamydia.  GERD and constipation.     Dark urine.  Normal urine dip.   Plan: Mammogram screening discussed. Self breast awareness reviewed. Pap and HR HPV as above. Guidelines for Calcium, Vitamin D, regular exercise program including cardiovascular and weight bearing exercise. Restart Yaz for one year.  STD screening and lipid profile.  Follow up annually and prn.   After visit summary provided.

## 2019-05-12 NOTE — Patient Instructions (Signed)

## 2019-05-13 LAB — LIPID PANEL
Chol/HDL Ratio: 2.5 ratio (ref 0.0–4.4)
Cholesterol, Total: 187 mg/dL (ref 100–199)
HDL: 76 mg/dL (ref >39)
LDL Chol Calc (NIH): 94 mg/dL (ref 0–99)
Triglycerides: 97 mg/dL (ref 0–149)
VLDL Cholesterol Cal: 17 mg/dL (ref 5–40)

## 2019-05-13 LAB — HEPATITIS C ANTIBODY: Hep C Virus Ab: <0.1 s/co ratio (ref 0.0–0.9)

## 2019-05-13 LAB — HEP, RPR, HIV PANEL
HIV Screen 4th Generation wRfx: NONREACTIVE
Hepatitis B Surface Ag: NEGATIVE
RPR Ser Ql: NONREACTIVE

## 2019-05-14 LAB — CERVICOVAGINAL ANCILLARY ONLY
Chlamydia: NEGATIVE
Comment: NEGATIVE
Comment: NEGATIVE
Comment: NORMAL
Neisseria Gonorrhea: NEGATIVE
Trichomonas: NEGATIVE

## 2019-05-19 ENCOUNTER — Other Ambulatory Visit: Payer: Self-pay

## 2019-05-21 ENCOUNTER — Encounter: Payer: Self-pay | Admitting: Certified Nurse Midwife

## 2019-05-21 ENCOUNTER — Ambulatory Visit: Payer: Commercial Managed Care - PPO | Admitting: Certified Nurse Midwife

## 2019-05-21 ENCOUNTER — Other Ambulatory Visit: Payer: Self-pay

## 2019-05-21 VITALS — BP 110/70 | HR 68 | Temp 97.1°F | Resp 16 | Wt 106.0 lb

## 2019-05-21 DIAGNOSIS — R82998 Other abnormal findings in urine: Secondary | ICD-10-CM | POA: Diagnosis not present

## 2019-05-21 DIAGNOSIS — Z113 Encounter for screening for infections with a predominantly sexual mode of transmission: Secondary | ICD-10-CM

## 2019-05-21 LAB — POCT URINALYSIS DIPSTICK
Bilirubin, UA: NEGATIVE
Blood, UA: NEGATIVE
Glucose, UA: NEGATIVE
Ketones, UA: NEGATIVE
Leukocytes, UA: NEGATIVE
Nitrite, UA: NEGATIVE
Protein, UA: NEGATIVE
Urobilinogen, UA: NEGATIVE E.U./dL — AB
pH, UA: 5 (ref 5.0–8.0)

## 2019-05-21 NOTE — Patient Instructions (Addendum)
Urinary Frequency, Adult Urinary frequency means urinating more often than usual. You may urinate every 1-2 hours even though you drink a normal amount of fluid and do not have a bladder infection or condition. Although you urinate more often than normal, the total amount of urine produced in a day is normal. With urinary frequency, you may have an urgent need to urinate often. The stress and anxiety of needing to find a bathroom quickly can make this urge worse. This condition may go away on its own or you may need treatment at home. Home treatment may include bladder training, exercises, taking medicines, or making changes to your diet. Follow these instructions at home: Bladder health   Keep a bladder diary if told by your health care provider. Keep track of: ? What you eat and drink. ? How often you urinate. ? How much you urinate.  Follow a bladder training program if told by your health care provider. This may include: ? Learning to delay going to the bathroom. ? Double urinating (voiding). This helps if you are not completely emptying your bladder. ? Scheduled voiding.  Do Kegel exercises as told by your health care provider. Kegel exercises strengthen the muscles that help control urination, which may help the condition. Eating and drinking  If told by your health care provider, make diet changes, such as: ? Avoiding caffeine. ? Drinking fewer fluids, especially alcohol. ? Not drinking in the evening. ? Avoiding foods or drinks that may irritate the bladder. These include coffee, tea, soda, artificial sweeteners, citrus, tomato-based foods, and chocolate. ? Eating foods that help prevent or ease constipation. Constipation can make this condition worse. Your health care provider may recommend that you:  Drink enough fluid to keep your urine pale yellow.  Take over-the-counter or prescription medicines.  Eat foods that are high in fiber, such as beans, whole grains, and fresh  fruits and vegetables.  Limit foods that are high in fat and processed sugars, such as fried or sweet foods. General instructions  Take over-the-counter and prescription medicines only as told by your health care provider.  Keep all follow-up visits as told by your health care provider. This is important. Contact a health care provider if:  You start urinating more often.  You feel pain or irritation when you urinate.  You notice blood in your urine.  Your urine looks cloudy.  You develop a fever.  You begin vomiting. Get help right away if:  You are unable to urinate. Summary  Urinary frequency means urinating more often than usual. With urinary frequency, you may urinate every 1-2 hours even though you drink a normal amount of fluid and do not have a bladder infection or other bladder condition.  Your health care provider may recommend that you keep a bladder diary, follow a bladder training program, or make dietary changes.  If told by your health care provider, do Kegel exercises to strengthen the muscles that help control urination.  Take over-the-counter and prescription medicines only as told by your health care provider.  Contact a health care provider if your symptoms do not improve or get worse. This information is not intended to replace advice given to you by your health care provider. Make sure you discuss any questions you have with your health care provider. Document Released: 04/20/2009 Document Revised: 01/01/2018 Document Reviewed: 01/01/2018 Elsevier Patient Education  2020 Elsevier Inc. Preventing Sexually Transmitted Infections, Adult Sexually transmitted infections (STIs) are diseases that are passed (transmitted) from person to  person through bodily fluids exchanged during sex or sexual contact. Bodily fluids include saliva, semen, blood, vaginal mucus, and urine. You may have an increased risk for developing an STI if you have unprotected oral, vaginal,  or anal sex. Some common STIs include:  Herpes.  Hepatitis B.  Chlamydia.  Gonorrhea.  Syphilis.  HPV (human papillomavirus).  HIV (human immunodeficiency virus), the virus that can cause AIDS (acquired immunodeficiency syndrome). How can I protect myself from sexually transmitted infections? The only way to completely prevent STIs is not to have sex of any kind (practice abstinence). This includes oral, vaginal, or anal sex. If you are sexually active, take these actions to lower your risk of getting an STI:  Have only one sex partner (be monogamous) or limit the number of sexual partners you have.  Stay up-to-date on immunizations. Certain vaccines can lower your risk of getting certain STIs, such as: ? Hepatitis A and B vaccines. You may have been vaccinated as a young child, but likely need a booster shot as a teen or young adult. ? HPV vaccine.  Use methods that prevent the exchange of body fluids between partners (barrier protection) every time you have sex. Barrier protection can be used during oral, vaginal, or anal sex. Commonly used barrier methods include: ? Female condom. ? Female condom. ? Dental dam.  Get tested regularly for STIs. Have your sexual partner get tested regularly as well.  Avoid mixing alcohol, drugs, and sex. Alcohol and drug use can affect your ability to make good decisions and can lead to risky sexual behaviors.  Ask your health care provider about taking pre-exposure prophylaxis (PrEP) to prevent HIV infection if you: ? Have a HIV-positive sexual partner. ? Have multiple sexual partners or partners who do not know their HIV status, and do not regularly use a condom during sex. ? Use injection drugs and share needles. Birth control pills, injections, implants, and intrauterine devices (IUDs) do not protect against STIs. To prevent both STIs and pregnancy, always use a condom with another form of birth control. Some STIs, such as herpes, are spread  through skin to skin contact. A condom does not protect you from getting such STIs. If you or your partner have herpes and there is an active flare with open sores, avoid all sexual contact. Why are these changes important? Taking steps to practice safe sex protects you and others. Many STIs can be cured. However, some STIs are not curable and will affect you for the rest of your life. STIs can be passed on to another person even if you do not have symptoms. What can happen if changes are not made? Certain STIs may:  Require you to take medicine for the rest of your life.  Affect your ability to have children (your fertility).  Increase your risk for developing another STI or certain serious health conditions, such as: ? Cervical cancer. ? Head and neck cancer. ? Pelvic inflammatory disease (PID) in women. ? Organ damage or damage to other parts of your body, if the infection spreads.  Be passed to a baby during childbirth. How are sexually transmitted infections treated? If you or your partner know or think that you may have an STI:  Talk with your health care provider about what can be done to treat it. Some STIs can be treated and cured with medicines.  For curable STIs, you and your partner should avoid sex during treatment and for several days after treatment is complete.  You  and your partner should both be treated at the same time, if there is any chance that your partner is infected as well. If you get treatment but your partner does not, your partner can re-infect you when you resume sexual contact.  Do not have unprotected sex. Where to find more information Learn more about sexually transmitted diseases and infections from:  Centers for Disease Control and Prevention: ? More information about specific STIs: SolutionApps.co.zawww.cdc.gov/std ? Find places to get sexual health counseling and treatment for free or for a low cost: gettested.TonerPromos.nocdc.gov  U.S. Department of Health and Human  Services: NotebookPreviews.siwww.womenshealth.gov/publications/our-publications/fact-sheet/sexually-transmitted-infections.html Summary  The only way to completely prevent STIs is not to have sex (practice abstinence), including oral, vaginal, or anal sex.  STIs can spread through saliva, semen, blood, vaginal mucus, urine, or sexual contact.  If you do have sex, limit your number of sexual partners and use a barrier protection method every time you have sex.  If you develop an STI, get treated right away and ask your partner to be treated as well. Do not resume having sex until both of you have completed treatment for the STI. This information is not intended to replace advice given to you by your health care provider. Make sure you discuss any questions you have with your health care provider. Document Released: 06/20/2016 Document Revised: 11/28/2017 Document Reviewed: 06/20/2016 Elsevier Patient Education  2020 [employer]

## 2019-05-21 NOTE — Progress Notes (Signed)
28 y.o. Single Hispanic female G0P0000 here with complaint of vaginal symptoms of itching, and thick white  discharge. Onset of symptoms 2-3 days ago. Denies new personal products or vaginal dryness. She has STD concerns and would like screening. Has had new partner recently, no condom use. Urinary symptoms same, unchanged, noted darker color, but drinking beet juice and very little water. Denies fever, chills or back pain. Some frequency only. Contraception is condoms working well. Has Rx for contraception to start once periods start.  Review of Systems  Constitutional:       Lightheaded  Genitourinary:       Dark urine, adbominal cramping  Skin:       Vaginal itching, irritation, green discharge    O:Healthy female WDWN Affect: normal, orientation x 3  poct urine-neg  Exam:Skin: warm and dry CVAT: negative bilateral Abdomen:soft, non tender, negative suprapubic  Inguinal Lymph nodes: no enlargement or tenderness Pelvic exam: External genital: normal female BUS: negative Bladder, urethra, urethral meatus non tender Vagina: white thick discharge noted.  Affirm taken, GC/Chlamydia  Cervix: normal, non tender, no CMT, blood noted from cervix with period onset Uterus: normal, non tender Adnexa:normal, non tender, no masses or fullness noted  A:Normal pelvic exam Urine negative, color from beet juice use R/O STD's Contraception OCP   P:Discussed findings of increase vaginal discharge and etiology. Discussed Aveeno or baking soda sitz bath for comfort. Discussed no indication of UTI, urine negative. Stop beet juice and increase water so urine will return to normal color. Discussed STD prevention and limited partners. Rescreen HIV,RPR,Hep C in 3 months. Start OCP today due period onset. Lab: Affirm, GC,Chlamydia Questions addressed.  Rv prn

## 2019-05-22 LAB — VAGINITIS/VAGINOSIS, DNA PROBE
Candida Species: NEGATIVE
Gardnerella vaginalis: POSITIVE — AB
Trichomonas vaginosis: NEGATIVE

## 2019-05-24 ENCOUNTER — Other Ambulatory Visit: Payer: Self-pay

## 2019-05-24 MED ORDER — METRONIDAZOLE 500 MG PO TABS: 500.0000 mg | ORAL_TABLET | Freq: Two times a day (BID) | ORAL | 0 refills | Status: DC

## 2019-05-25 LAB — GC/CHLAMYDIA PROBE AMP
Chlamydia trachomatis, NAA: NEGATIVE
Neisseria Gonorrhoeae by PCR: NEGATIVE

## 2019-05-27 ENCOUNTER — Ambulatory Visit (INDEPENDENT_AMBULATORY_CARE_PROVIDER_SITE_OTHER): Payer: Commercial Managed Care - PPO | Admitting: Psychology

## 2019-05-27 DIAGNOSIS — F411 Generalized anxiety disorder: Secondary | ICD-10-CM | POA: Diagnosis not present

## 2019-07-07 ENCOUNTER — Other Ambulatory Visit: Payer: Commercial Managed Care - PPO

## 2019-07-07 ENCOUNTER — Ambulatory Visit: Payer: Commercial Managed Care - PPO | Attending: Internal Medicine

## 2019-07-07 ENCOUNTER — Ambulatory Visit: Payer: Commercial Managed Care - PPO | Admitting: Psychology

## 2019-07-07 DIAGNOSIS — Z20822 Contact with and (suspected) exposure to covid-19: Secondary | ICD-10-CM

## 2019-07-08 LAB — NOVEL CORONAVIRUS, NAA: SARS-CoV-2, NAA: NOT DETECTED

## 2019-07-26 ENCOUNTER — Ambulatory Visit: Payer: Commercial Managed Care - PPO | Admitting: Psychology

## 2019-07-28 ENCOUNTER — Ambulatory Visit (INDEPENDENT_AMBULATORY_CARE_PROVIDER_SITE_OTHER): Payer: 59 | Admitting: Psychology

## 2019-07-28 DIAGNOSIS — F411 Generalized anxiety disorder: Secondary | ICD-10-CM | POA: Diagnosis not present

## 2019-09-01 ENCOUNTER — Ambulatory Visit (INDEPENDENT_AMBULATORY_CARE_PROVIDER_SITE_OTHER): Payer: 59 | Admitting: Psychology

## 2019-09-01 DIAGNOSIS — F411 Generalized anxiety disorder: Secondary | ICD-10-CM

## 2019-09-20 ENCOUNTER — Encounter: Payer: Self-pay | Admitting: Certified Nurse Midwife

## 2019-09-22 ENCOUNTER — Encounter: Payer: Self-pay | Admitting: Certified Nurse Midwife

## 2019-09-29 ENCOUNTER — Ambulatory Visit (INDEPENDENT_AMBULATORY_CARE_PROVIDER_SITE_OTHER): Payer: 59 | Admitting: Psychology

## 2019-09-29 DIAGNOSIS — F411 Generalized anxiety disorder: Secondary | ICD-10-CM | POA: Diagnosis not present

## 2019-11-10 ENCOUNTER — Ambulatory Visit: Payer: 59 | Admitting: Psychology

## 2019-12-09 ENCOUNTER — Ambulatory Visit (INDEPENDENT_AMBULATORY_CARE_PROVIDER_SITE_OTHER): Payer: 59 | Admitting: Psychology

## 2019-12-09 DIAGNOSIS — F411 Generalized anxiety disorder: Secondary | ICD-10-CM | POA: Diagnosis not present

## 2020-01-06 ENCOUNTER — Ambulatory Visit (INDEPENDENT_AMBULATORY_CARE_PROVIDER_SITE_OTHER): Payer: 59 | Admitting: Psychology

## 2020-01-06 DIAGNOSIS — F411 Generalized anxiety disorder: Secondary | ICD-10-CM

## 2020-01-14 ENCOUNTER — Telehealth: Payer: Self-pay | Admitting: Obstetrics and Gynecology

## 2020-01-14 NOTE — Telephone Encounter (Signed)
AEX 05/2019, next scheduled 05/2020 with BS H/o chlamydia  Elevated LDLs Migraines w/o aura LMP 12/21/19  Spoke with pt. Pt states wanting to come for OV for STD testing due to changing partners and having unprotected sex. Pt denies any chance of pregnancy. Has not taken home UPT. Pt not currently taking Yaz Rx. Pt states only took 2 days of Yaz Rx in Nov 2020 due to having anxiety attack.   Pt also states feeling dizzy, lightheaded intermittently, but around 2-3 times in the last 2 months. Pt wondering if could be low BP?  Pt has not taken BP at home or pharmacy or called PCP for follow up. Pt denies any heavy vaginal bleeding or clots with last LMP,but did get migraine in June with cycle. Pt has increased exercise, but unsure if that is causing sx. Denies passing out or feeling faint.   Advised pt to have OV to discuss sx and STD testing. Pt agreeable.  Pt scheduled with Dr Edward Jolly on 01/20/20 at 9 am. Pt agreeable and verbalized understanding of date and time of appt. Pt advised ER precautions with worsening feeling of dizzy and lightheaded. Pt agreeable.   Routing to Dr Edward Jolly for review.  Encounter closed.

## 2020-01-14 NOTE — Telephone Encounter (Signed)
Patient would like to come in for labs and "testing". No further details given.

## 2020-01-18 NOTE — Progress Notes (Signed)
GYNECOLOGY  VISIT   HPI: 29 y.o.   Single  Hispanic  female   G0P0000 with Patient's last menstrual period was 01/17/2020.   here for   Std screening  New partner.   Saw her PCP for lightheadedness and feeling like she would faint.  Comes and goes quickly.  Loss of appetite.  She was treated for potential vertigo with meclizine.  Patient did not use this.   She wants help with her acne.  She had a panic attack after she tried [place], and so she stopped it.  She did will with Nuvaring in the past.   GYNECOLOGIC HISTORY: Patient's last menstrual period was 01/17/2020. Contraception:  Condoms  Menopausal hormone therapy:  none Last mammogram:  n/a Last pap smear:  03-26-18 Neg, 02-25-17 Neg, 03-02-14 Neg        OB History    Gravida  0   Para  0   Term  0   Preterm  0   AB  0   Living  0     SAB  0   TAB  0   Ectopic  0   Multiple  0   Live Births  0              Patient Active Problem List   Diagnosis Date Noted  . Migraine without aura and with status migrainosus, not intractable 03/05/2016    Past Medical History:  Diagnosis Date  . Frequent headaches   . GERD (gastroesophageal reflux disease)   . Hyperlipemia   . Migraines    No aura.  . STD (sexually transmitted disease) 02/09/2016   Chlamydia    Past Surgical History:  Procedure Laterality Date  . NO PAST SURGERIES      Current Outpatient Medications  Medication Sig Dispense Refill  . ketoconazole (NIZORAL) 2 % shampoo     . omeprazole (PRILOSEC) 40 MG capsule as needed.    . tretinoin (RETIN-A) 0.1 % cream Apply to face every night     No current facility-administered medications for this visit.     ALLERGIES: Patient has no known allergies.  Family History  Problem Relation Age of Onset  . Thyroid disease Mother   . Birth defects Mother   . Depression Mother   . Hypertension Maternal Grandmother   . Asthma Maternal Grandmother   . Migraines Maternal Grandfather   . Alcohol  abuse Maternal Grandfather     Social History   Socioeconomic History  . Marital status: Single    Spouse name: Not on file  . Number of children: 0  . Years of education: 22  . Highest education level: Not on file  Occupational History  . Occupation: Nurse, children's  Tobacco Use  . Smoking status: Never Smoker  . Smokeless tobacco: Never Used  Vaping Use  . Vaping Use: Never used  Substance and Sexual Activity  . Alcohol use: Yes    Alcohol/week: 2.0 standard drinks    Types: 2 Standard drinks or equivalent per week  . Drug use: No  . Sexual activity: Yes    Partners: Male    Birth control/protection: Condom  Other Topics Concern  . Not on file  Social History Narrative   Lives with mother    Caffeine use: Drinks coffee (2-3 days per week)   Exercise--  2-3 days a week   Social Determinants of Health   Financial Resource Strain:   . Difficulty of Paying Living Expenses:   Food Insecurity:   .  Worried About Programme researcher, broadcasting/film/video in the Last Year:   . Barista in the Last Year:   Transportation Needs:   . Freight forwarder (Medical):   Marland Kitchen Lack of Transportation (Non-Medical):   Physical Activity:   . Days of Exercise per Week:   . Minutes of Exercise per Session:   Stress:   . Feeling of Stress :   Social Connections:   . Frequency of Communication with Friends and Family:   . Frequency of Social Gatherings with Friends and Family:   . Attends Religious Services:   . Active Member of Clubs or Organizations:   . Attends Banker Meetings:   Marland Kitchen Marital Status:   Intimate Partner Violence:   . Fear of Current or Ex-Partner:   . Emotionally Abused:   Marland Kitchen Physically Abused:   . Sexually Abused:     Review of Systems  Neurological: Positive for dizziness.  Psychiatric/Behavioral: The patient is nervous/anxious.   All other systems reviewed and are negative.   PHYSICAL EXAMINATION:    BP 104/70 (BP Location: Right Arm, Patient Position:  Sitting, Cuff Size: Normal)   Pulse 68   Resp 12   Ht 4\' 11"  (1.499 m)   Wt 110 lb 1.6 oz (49.9 kg)   LMP 01/17/2020   BMI 22.24 kg/m     General appearance: alert, cooperative and appears stated age Head: Normocephalic, without obvious abnormality, atraumatic  Pelvic: External genitalia:  no lesions              Urethra:  normal appearing urethra with no masses, tenderness or lesions              Bartholins and Skenes: normal                 Vagina: normal appearing vagina with normal color and discharge, no lesions              Cervix: no lesions                Bimanual Exam:  Uterus:  normal size, contour, position, consistency, mobility, non-tender              Adnexa: no mass, fullness, tenderness            Chaperone was present for exam.  ASSESSMENT  STD screening.  Contraception consultation.  Dizziness.  Hx migraine without aura.   PLAN  STD testing.  Start NuvaRing.  Instructed in use.  Refills until annual exam is due. CBC, TSH, CMP.  I recommend hydration with noncaffeinated beverages.  If her dizziness persists, I recommend she return to her PCP.  Cardiac work up may be helpful.

## 2020-01-20 ENCOUNTER — Other Ambulatory Visit: Payer: Self-pay

## 2020-01-20 ENCOUNTER — Other Ambulatory Visit (HOSPITAL_COMMUNITY)
Admission: RE | Admit: 2020-01-20 | Discharge: 2020-01-20 | Disposition: A | Discharge status: Home / Self Care | Payer: Managed Care, Other (non HMO) | Source: Ambulatory Visit | Attending: Obstetrics and Gynecology | Admitting: Obstetrics and Gynecology

## 2020-01-20 ENCOUNTER — Encounter: Payer: Self-pay | Admitting: Obstetrics and Gynecology

## 2020-01-20 ENCOUNTER — Ambulatory Visit: Payer: Managed Care, Other (non HMO) | Admitting: Obstetrics and Gynecology

## 2020-01-20 VITALS — BP 104/70 | HR 68 | Resp 12 | Ht 59.0 in | Wt 110.1 lb

## 2020-01-20 DIAGNOSIS — Z3009 Encounter for other general counseling and advice on contraception: Secondary | ICD-10-CM | POA: Diagnosis not present

## 2020-01-20 DIAGNOSIS — Z113 Encounter for screening for infections with a predominantly sexual mode of transmission: Secondary | ICD-10-CM

## 2020-01-20 DIAGNOSIS — R42 Dizziness and giddiness: Secondary | ICD-10-CM

## 2020-01-20 MED ORDER — ETONOGESTREL-ETHINYL ESTRADIOL 0.12-0.015 MG/24HR VA RING
1.0000 | VAGINAL_RING | VAGINAL | 1 refills | Status: DC
Start: 2020-01-20 — End: 2020-05-16

## 2020-01-21 LAB — TSH: TSH: 2.52 u[IU]/mL (ref 0.450–4.500)

## 2020-01-21 LAB — HEP, RPR, HIV PANEL
HIV Screen 4th Generation wRfx: NONREACTIVE
Hepatitis B Surface Ag: NEGATIVE
RPR Ser Ql: NONREACTIVE

## 2020-01-21 LAB — COMPREHENSIVE METABOLIC PANEL
ALT: 10 IU/L (ref 0–32)
AST: 15 IU/L (ref 0–40)
Albumin/Globulin Ratio: 1.5 (ref 1.2–2.2)
Albumin: 4.2 g/dL (ref 3.9–5.0)
Alkaline Phosphatase: 68 IU/L (ref 48–121)
BUN/Creatinine Ratio: 12 (ref 9–23)
BUN: 9 mg/dL (ref 6–20)
Bilirubin Total: 0.3 mg/dL (ref 0.0–1.2)
CO2: 22 mmol/L (ref 20–29)
Calcium: 9.1 mg/dL (ref 8.7–10.2)
Chloride: 104 mmol/L (ref 96–106)
Creatinine, Ser: 0.76 mg/dL (ref 0.57–1.00)
GFR calc Af Amer: 124 mL/min/{1.73_m2} (ref >59)
GFR calc non Af Amer: 108 mL/min/{1.73_m2} (ref >59)
Globulin, Total: 2.8 g/dL (ref 1.5–4.5)
Glucose: 87 mg/dL (ref 65–99)
Potassium: 4.3 mmol/L (ref 3.5–5.2)
Sodium: 138 mmol/L (ref 134–144)
Total Protein: 7 g/dL (ref 6.0–8.5)

## 2020-01-21 LAB — CERVICOVAGINAL ANCILLARY ONLY
Chlamydia: NEGATIVE
Comment: NEGATIVE
Comment: NEGATIVE
Comment: NORMAL
Neisseria Gonorrhea: NEGATIVE
Trichomonas: NEGATIVE

## 2020-01-21 LAB — CBC
Hematocrit: 37.6 % (ref 34.0–46.6)
Hemoglobin: 12.5 g/dL (ref 11.1–15.9)
MCH: 30.9 pg (ref 26.6–33.0)
MCHC: 33.2 g/dL (ref 31.5–35.7)
MCV: 93 fL (ref 79–97)
Platelets: 296 10*3/uL (ref 150–450)
RBC: 4.04 x10E6/uL (ref 3.77–5.28)
RDW: 12.2 % (ref 11.7–15.4)
WBC: 5 10*3/uL (ref 3.4–10.8)

## 2020-01-21 LAB — HEPATITIS C ANTIBODY: Hep C Virus Ab: <0.1 s/co ratio (ref 0.0–0.9)

## 2020-01-27 ENCOUNTER — Ambulatory Visit (INDEPENDENT_AMBULATORY_CARE_PROVIDER_SITE_OTHER): Payer: 59 | Admitting: Psychology

## 2020-01-27 DIAGNOSIS — F411 Generalized anxiety disorder: Secondary | ICD-10-CM | POA: Diagnosis not present

## 2020-02-18 ENCOUNTER — Ambulatory Visit (INDEPENDENT_AMBULATORY_CARE_PROVIDER_SITE_OTHER): Payer: 59 | Admitting: Psychology

## 2020-02-18 DIAGNOSIS — F411 Generalized anxiety disorder: Secondary | ICD-10-CM

## 2020-03-10 ENCOUNTER — Ambulatory Visit (INDEPENDENT_AMBULATORY_CARE_PROVIDER_SITE_OTHER): Payer: 59 | Admitting: Psychology

## 2020-03-10 DIAGNOSIS — F411 Generalized anxiety disorder: Secondary | ICD-10-CM | POA: Diagnosis not present

## 2020-03-21 ENCOUNTER — Telehealth: Payer: Self-pay | Admitting: Obstetrics and Gynecology

## 2020-03-21 NOTE — Telephone Encounter (Signed)
Spoke with patient. Nuvaring has been in for 27 days, forgot to remove on day 21, asking when to change nuvaring?    Reviewed UpTo Date, advised Nuvaring can be used up to 28 days (4wks) and still be effective. Remove ring for 7 days to allow for menses and replace with new ring. Use BUM for 7 days if SA. Also have the option to start new ring at day 28, may experience some irregular bleeding. Advised I will send to Dr. Edward Jolly to review and return call if any additional recommendations. Questions answered.   Routing to provider for final review. Patient is agreeable to disposition. Will close encounter.

## 2020-03-21 NOTE — Telephone Encounter (Signed)
Patient has questions about her nuvaring.

## 2020-03-28 ENCOUNTER — Ambulatory Visit (INDEPENDENT_AMBULATORY_CARE_PROVIDER_SITE_OTHER): Payer: 59 | Admitting: Psychology

## 2020-03-28 DIAGNOSIS — F411 Generalized anxiety disorder: Secondary | ICD-10-CM | POA: Diagnosis not present

## 2020-04-13 ENCOUNTER — Telehealth: Payer: Self-pay

## 2020-04-13 NOTE — Telephone Encounter (Signed)
Call to patient. Message given to patient as seen below from Dr. Edward Jolly and she verbalized understanding. Patient states she will place prior to Saturday.   Routing to provider and will close encounter.

## 2020-04-13 NOTE — Telephone Encounter (Signed)
Patient's nuvaring fell out and she's calling to speak with nurse to find out what should she do.

## 2020-04-13 NOTE — Telephone Encounter (Signed)
Since the NuvarRing will be out for more than 7 days, she will not be protected against pregnancy if she plans to replace it on day 8.  I would recommend she place it before then so she does not have a break in contraception.

## 2020-04-13 NOTE — Telephone Encounter (Signed)
AEX 05/2019 changed from OCPs to Nuvaring in 01/2020.  Spoke with pt. Pt states Nuva ring " fell out" on Saturday with intercourse possible states has  felt for it and is not in vagina. Pt states this happened last month as well, but pt states saw it then and replaced it. Pt states thinks it because of generic instead of brand Nuvaring since not having this trouble in the past when used before.  Pt states since Nuvaring out on Saturday. Pt started menses on 10/6 and is having normal bleeding. Denies clots, heavy bleeding or abd cramps.  Pt states feels comfortable to use new Nuvaring on Sunday 04/16/20. Pt advised to call with any vaginal discharge, odor or any concerns. Pt agreeable. Pt has AEX on 05/16/20 with Dr Edward Jolly. Pt aware and states will keep appt and give update to Dr Edward Jolly at this time. Declines OV at this time.   Routing to Dr Edward Jolly for review.  Encounter closed.

## 2020-05-04 ENCOUNTER — Ambulatory Visit (INDEPENDENT_AMBULATORY_CARE_PROVIDER_SITE_OTHER): Payer: 59 | Admitting: Psychology

## 2020-05-04 DIAGNOSIS — F411 Generalized anxiety disorder: Secondary | ICD-10-CM | POA: Diagnosis not present

## 2020-05-08 ENCOUNTER — Telehealth: Payer: Self-pay

## 2020-05-08 NOTE — Telephone Encounter (Signed)
AEX 05/16/20 with BS LMP: 04/12/20  Spoke with pt. Pt states started back on Nuva Ring Rx in July and was given generic Rx. States having mood swings, being more emotional and hair falling out for past 2 months. Denies any cramps, BTB, or any vaginal sx.  Pt states having to start new ring this Sat. 11/6 and wanting to see if can have Brand Nuva Ring Rx to use this next month?  Advised will call pharmacy to see if can have Brand with refill. Will return call with update. Pt agreeable.   Call placed to Pharmacy on file. Pharmacy states does not have Brand in stock. [facility]   [facility] [address] [address]. States has Brand not covered by insurance. Cost prohibitive.

## 2020-05-08 NOTE — Telephone Encounter (Signed)
Call placed back to pt. Pt states is cost prohibitive for Brand Rx without insurance. Good Rx coupon same cost. Pt advised to get refill of current generic Rx Nuvaring and place on 11/6 as scheduled and discuss issues, possible change in birth control with Dr Edward Jolly at AEX on 118/21. Pt agreeable and verbalized understanding. Pt is SA and does not want to go without contraception. Pt agreeable to plan of care.  Routing to Dr Edward Jolly for update Encounter closed

## 2020-05-08 NOTE — Telephone Encounter (Signed)
Patient would like to update nurse on birth control prescription.

## 2020-05-16 ENCOUNTER — Encounter: Payer: Self-pay | Admitting: Obstetrics and Gynecology

## 2020-05-16 ENCOUNTER — Ambulatory Visit (INDEPENDENT_AMBULATORY_CARE_PROVIDER_SITE_OTHER): Payer: Managed Care, Other (non HMO) | Admitting: Obstetrics and Gynecology

## 2020-05-16 ENCOUNTER — Other Ambulatory Visit: Payer: Self-pay

## 2020-05-16 ENCOUNTER — Other Ambulatory Visit (HOSPITAL_COMMUNITY)
Admission: RE | Admit: 2020-05-16 | Discharge: 2020-05-16 | Disposition: A | Discharge status: Home / Self Care | Payer: Managed Care, Other (non HMO) | Source: Ambulatory Visit | Attending: Obstetrics and Gynecology | Admitting: Obstetrics and Gynecology

## 2020-05-16 VITALS — BP 118/72 | HR 76 | Resp 16 | Ht 59.25 in | Wt 111.0 lb

## 2020-05-16 DIAGNOSIS — Z8639 Personal history of other endocrine, nutritional and metabolic disease: Secondary | ICD-10-CM

## 2020-05-16 DIAGNOSIS — Z113 Encounter for screening for infections with a predominantly sexual mode of transmission: Secondary | ICD-10-CM | POA: Insufficient documentation

## 2020-05-16 DIAGNOSIS — Z01419 Encounter for gynecological examination (general) (routine) without abnormal findings: Secondary | ICD-10-CM | POA: Diagnosis not present

## 2020-05-16 NOTE — Progress Notes (Signed)
29 y.o. G0P0000 Single Hispanic female here for annual exam.    Tried pills and she had an anxiety attack.   She tried NuvaRing, and felt sadness and hair loss.  Having some sores in her scalp also.  She has a dermatologist.   Menses are monthly and short.   Would like STD screening, cholesterol, vit D, and TSH.  Got Covid in August.  Has not done a flu vaccine.   PCP:  Orland Mustard, MD   Patient's last menstrual period was 05/10/2020.           Sexually active: Yes.    The current method of family planning is condoms sometimes.    Exercising: Yes.    twice a week Smoker:  no  Health Maintenance: Pap:  03/26/18 Neg  02/25/17 Neg History of abnormal Pap:  no TDaP:  2014 Gardasil:   Yes, completed series HIV and Hep C: 01/20/20 Negative Screening Labs:  Discuss today   reports that she has never smoked. She has never used smokeless tobacco. She reports current alcohol use of about 2.0 standard drinks of alcohol per week. She reports that she does not use drugs.  Past Medical History:  Diagnosis Date  . Frequent headaches   . GERD (gastroesophageal reflux disease)   . Hyperlipemia   . Migraines    No aura.  . STD (sexually transmitted disease) 02/09/2016   Chlamydia    Past Surgical History:  Procedure Laterality Date  . NO PAST SURGERIES      Current Outpatient Medications  Medication Sig Dispense Refill  . ketoconazole (NIZORAL) 2 % shampoo     . tretinoin (RETIN-A) 0.1 % cream Apply to face every night    . omeprazole (PRILOSEC) 40 MG capsule as needed. (Patient not taking: Reported on 05/16/2020)     No current facility-administered medications for this visit.    Family History  Problem Relation Age of Onset  . Thyroid disease Mother   . Birth defects Mother   . Depression Mother   . Hypertension Maternal Grandmother   . Asthma Maternal Grandmother   . Migraines Maternal Grandfather   . Alcohol abuse Maternal Grandfather     Review of Systems   Constitutional: Negative.   HENT: Negative.   Eyes: Negative.   Respiratory: Negative.   Cardiovascular: Negative.   Gastrointestinal: Negative.   Endocrine: Negative.   Genitourinary: Negative.   Musculoskeletal: Negative.   Skin: Negative.   Allergic/Immunologic: Negative.   Neurological: Negative.   Hematological: Negative.   Psychiatric/Behavioral: Negative.     Exam:   BP 118/72 (BP Location: Right Arm, Patient Position: Sitting, Cuff Size: Normal)   Pulse 76   Resp 16   Ht 4' 11.25" (1.505 m)   Wt 111 lb (50.3 kg)   LMP 05/10/2020   BMI 22.23 kg/m     General appearance: alert, cooperative and appears stated age Head: normocephalic, without obvious abnormality, atraumatic Neck: no adenopathy, supple, symmetrical, trachea midline and thyroid normal to inspection and palpation Lungs: clear to auscultation bilaterally Breasts: normal appearance, no masses or tenderness, No nipple retraction or dimpling, No nipple discharge or bleeding, No axillary adenopathy Heart: regular rate and rhythm Abdomen: soft, non-tender; no masses, no organomegaly Extremities: extremities normal, atraumatic, no cyanosis or edema Skin: skin color, texture, turgor normal. No rashes or lesions Lymph nodes: cervical, supraclavicular, and axillary nodes normal. Neurologic: grossly normal  Pelvic: External genitalia:  no lesions  No abnormal inguinal nodes palpated.              Urethra:  normal appearing urethra with no masses, tenderness or lesions              Bartholins and Skenes: normal                 Vagina: normal appearing vagina with normal color and discharge, no lesions              Cervix: no lesions              Pap taken: No. Bimanual Exam:  Uterus:  normal size, contour, position, consistency, mobility, non-tender              Adnexa: no mass, fullness, tenderness          Chaperone was present for exam.  Assessment:   Well woman visit with normal  exam. Migraine without aura. Hair loss and scalp sores.  STD screening.   Plan: Mammogram screening discussed. Self breast awareness reviewed. Pap and HR HPV as above. Guidelines for Calcium, Vitamin D, regular exercise program including cardiovascular and weight bearing exercise. She will follow up with dermatology, Trecia Rogers.  STD screening and routine labs.  We reviewed NuvaRing, condoms, diaphragm, and Paragard IUD. She declines these for now. Follow up annually and prn.

## 2020-05-16 NOTE — Patient Instructions (Signed)

## 2020-05-17 LAB — LIPID PANEL
Chol/HDL Ratio: 3.8 ratio (ref 0.0–4.4)
Cholesterol, Total: 292 mg/dL — ABNORMAL HIGH (ref 100–199)
HDL: 76 mg/dL (ref >39)
LDL Chol Calc (NIH): 183 mg/dL — ABNORMAL HIGH (ref 0–99)
Triglycerides: 181 mg/dL — ABNORMAL HIGH (ref 0–149)
VLDL Cholesterol Cal: 33 mg/dL (ref 5–40)

## 2020-05-17 LAB — CERVICOVAGINAL ANCILLARY ONLY
Chlamydia: NEGATIVE
Comment: NEGATIVE
Comment: NEGATIVE
Comment: NORMAL
Neisseria Gonorrhea: NEGATIVE
Trichomonas: NEGATIVE

## 2020-05-17 LAB — HEPATITIS C ANTIBODY: Hep C Virus Ab: <0.1 s/co ratio (ref 0.0–0.9)

## 2020-05-17 LAB — VITAMIN D 25 HYDROXY (VIT D DEFICIENCY, FRACTURES): Vit D, 25-Hydroxy: 21.1 ng/mL — ABNORMAL LOW (ref 30.0–100.0)

## 2020-05-17 LAB — TSH: TSH: 0.782 u[IU]/mL (ref 0.450–4.500)

## 2020-05-17 LAB — HEP, RPR, HIV PANEL
HIV Screen 4th Generation wRfx: NONREACTIVE
Hepatitis B Surface Ag: NEGATIVE
RPR Ser Ql: NONREACTIVE

## 2020-05-17 NOTE — Addendum Note (Signed)
Addended by: Ardell Isaacs, Debbe Bales E on: 05/17/2020 10:17 AM   Modules accepted: Orders

## 2020-05-18 ENCOUNTER — Other Ambulatory Visit: Payer: Self-pay

## 2020-05-18 MED ORDER — VITAMIN D (ERGOCALCIFEROL) 1.25 MG (50000 UNIT) PO CAPS
50000.0000 [IU] | ORAL_CAPSULE | ORAL | 0 refills | Status: DC
Start: 2020-05-18 — End: 2020-09-04

## 2020-05-19 ENCOUNTER — Ambulatory Visit (INDEPENDENT_AMBULATORY_CARE_PROVIDER_SITE_OTHER): Payer: Managed Care, Other (non HMO) | Admitting: Psychology

## 2020-05-19 DIAGNOSIS — F411 Generalized anxiety disorder: Secondary | ICD-10-CM

## 2020-06-14 ENCOUNTER — Ambulatory Visit (INDEPENDENT_AMBULATORY_CARE_PROVIDER_SITE_OTHER): Payer: 59 | Admitting: Psychology

## 2020-06-14 DIAGNOSIS — F411 Generalized anxiety disorder: Secondary | ICD-10-CM

## 2020-07-14 ENCOUNTER — Ambulatory Visit (INDEPENDENT_AMBULATORY_CARE_PROVIDER_SITE_OTHER): Payer: 59 | Admitting: Psychology

## 2020-07-14 DIAGNOSIS — F411 Generalized anxiety disorder: Secondary | ICD-10-CM

## 2020-08-01 ENCOUNTER — Other Ambulatory Visit: Payer: Managed Care, Other (non HMO)

## 2020-08-10 ENCOUNTER — Other Ambulatory Visit: Payer: Managed Care, Other (non HMO)

## 2020-08-16 ENCOUNTER — Ambulatory Visit (INDEPENDENT_AMBULATORY_CARE_PROVIDER_SITE_OTHER): Payer: 59 | Admitting: Psychology

## 2020-08-16 DIAGNOSIS — F411 Generalized anxiety disorder: Secondary | ICD-10-CM | POA: Diagnosis not present

## 2020-08-31 ENCOUNTER — Ambulatory Visit (INDEPENDENT_AMBULATORY_CARE_PROVIDER_SITE_OTHER): Payer: Managed Care, Other (non HMO)

## 2020-08-31 ENCOUNTER — Other Ambulatory Visit: Payer: Self-pay

## 2020-08-31 ENCOUNTER — Ambulatory Visit (INDEPENDENT_AMBULATORY_CARE_PROVIDER_SITE_OTHER): Payer: Managed Care, Other (non HMO) | Admitting: Family Medicine

## 2020-08-31 ENCOUNTER — Encounter: Payer: Self-pay | Admitting: Family Medicine

## 2020-08-31 VITALS — BP 118/86 | HR 75 | Temp 98.3°F | Ht 59.25 in | Wt 109.0 lb

## 2020-08-31 DIAGNOSIS — L659 Nonscarring hair loss, unspecified: Secondary | ICD-10-CM | POA: Diagnosis not present

## 2020-08-31 DIAGNOSIS — E559 Vitamin D deficiency, unspecified: Secondary | ICD-10-CM

## 2020-08-31 DIAGNOSIS — R062 Wheezing: Secondary | ICD-10-CM

## 2020-08-31 DIAGNOSIS — Z0000 Encounter for general adult medical examination without abnormal findings: Secondary | ICD-10-CM | POA: Diagnosis not present

## 2020-08-31 MED ORDER — MECLIZINE HCL 25 MG PO TABS: 25.0000 mg | ORAL_TABLET | Freq: Three times a day (TID) | ORAL | 0 refills | Status: DC | PRN

## 2020-08-31 NOTE — Patient Instructions (Addendum)
-you have some findings suggestive of post nasal drip. Get over the counter flonase and use nightly for about 2 weeks to see if this helps sleep stuff.   -CXR today, although sounds more in throat. If normal we can discuss doing other things.   -routine labs (come back fasting)   -also sent in meclizine for you to use as needed for dizziness.    Preventive Care 71-29 Years Old, Female Preventive care refers to lifestyle choices and visits with your health care provider that can promote health and wellness. This includes:  A yearly physical exam. This is also called an annual wellness visit.  Regular dental and eye exams.  Immunizations.  Screening for certain conditions.  Healthy lifestyle choices, such as: ? Eating a healthy diet. ? Getting regular exercise. ? Not using drugs or products that contain nicotine and tobacco. ? Limiting alcohol use. What can I expect for my preventive care visit? Physical exam Your health care provider may check your:  Height and weight. These may be used to calculate your BMI (body mass index). BMI is a measurement that tells if you are at a healthy weight.  Heart rate and blood pressure.  Body temperature.  Skin for abnormal spots. Counseling Your health care provider may ask you questions about your:  Past medical problems.  Family's medical history.  Alcohol, tobacco, and drug use.  Emotional well-being.  Home life and relationship well-being.  Sexual activity.  Diet, exercise, and sleep habits.  Work and work Statistician.  Access to firearms.  Method of birth control.  Menstrual cycle.  Pregnancy history. What immunizations do I need? Vaccines are usually given at various ages, according to a schedule. Your health care provider will recommend vaccines for you based on your age, medical history, and lifestyle or other factors, such as travel or where you work.   What tests do I need? Blood tests  Lipid and  cholesterol levels. These may be checked every 5 years starting at age 16.  Hepatitis C test.  Hepatitis B test. Screening  Diabetes screening. This is done by checking your blood sugar (glucose) after you have not eaten for a while (fasting).  STD (sexually transmitted disease) testing, if you are at risk.  BRCA-related cancer screening. This may be done if you have a family history of breast, ovarian, tubal, or peritoneal cancers.  Pelvic exam and Pap test. This may be done every 3 years starting at age 67. Starting at age 39, this may be done every 5 years if you have a Pap test in combination with an HPV test. Talk with your health care provider about your test results, treatment options, and if necessary, the need for more tests.   Follow these instructions at home: Eating and drinking  Eat a healthy diet that includes fresh fruits and vegetables, whole grains, lean protein, and low-fat dairy products.  Take vitamin and mineral supplements as recommended by your health care provider.  Do not drink alcohol if: ? Your health care provider tells you not to drink. ? You are pregnant, may be pregnant, or are planning to become pregnant.  If you drink alcohol: ? Limit how much you have to 0-1 drink a day. ? Be aware of how much alcohol is in your drink. In the [place] one drink equals one 12 oz bottle of beer (355 mL), one 5 oz glass of wine (148 mL), or one 1 oz glass of hard liquor (44 mL).   Lifestyle  Take daily care of your teeth and gums. Brush your teeth every morning and night with fluoride toothpaste. Floss one time each day.  Stay active. Exercise for at least 30 minutes 5 or more days each week.  Do not use any products that contain nicotine or tobacco, such as cigarettes, e-cigarettes, and chewing tobacco. If you need help quitting, ask your health care provider.  Do not use drugs.  If you are sexually active, practice safe sex. Use a condom or other form of  protection to prevent STIs (sexually transmitted infections).  If you do not wish to become pregnant, use a form of birth control. If you plan to become pregnant, see your health care provider for a prepregnancy visit.  Find healthy ways to cope with stress, such as: ? Meditation, yoga, or listening to music. ? Journaling. ? Talking to a trusted person. ? Spending time with friends and family. Safety  Always wear your seat belt while driving or riding in a vehicle.  Do not drive: ? If you have been drinking alcohol. Do not ride with someone who has been drinking. ? When you are tired or distracted. ? While texting.  Wear a helmet and other protective equipment during sports activities.  If you have firearms in your house, make sure you follow all gun safety procedures.  Seek help if you have been physically or sexually abused. What's next?  Go to your health care provider once a year for an annual wellness visit.  Ask your health care provider how often you should have your eyes and teeth checked.  Stay up to date on all vaccines. This information is not intended to replace advice given to you by your health care provider. Make sure you discuss any questions you have with your health care provider. Document Revised: 02/20/2020 Document Reviewed: 03/05/2018 Elsevier Patient Education  2021 [employer].

## 2020-08-31 NOTE — Progress Notes (Signed)
Patient: Tara Chang MRN: [id number] DOB: 1991-03-07 PCP: Orland Mustard, MD     Subjective:  Chief Complaint  Patient presents with  . Annual Exam    HPI: The patient is a 30 y.o. female who presents today for annual exam. She denies any changes to past medical history. There have been no recent hospitalizations. They are following a well balanced diet and exercise plan. Weight has been stable. No complaints today. Screened for STDs back in 11/21.   Her cholesterol was high about 3 month ago at her gyn. She can't remember if she was fasting.   She also has what she describes as wheezing when she laughs or wakes up in the AM. She has no shortness of breath with this or cough. She has no issues in spin class. She has never smoked.   No family hx of colon or breast cancer in family   Immunization History  Administered Date(s) Administered  . HPV Quadrivalent 01/13/2013, 03/17/2013, 07/19/2013  . Tdap 03/17/2013   Colonoscopy: routine screening  Mammogram: routine screening  Pap smear: 03/16/2018   Review of Systems  Constitutional: Negative for chills, fatigue and fever.  HENT: Negative for dental problem, ear pain, hearing loss and trouble swallowing.   Eyes: Negative for visual disturbance.  Respiratory: Negative for cough, chest tightness and shortness of breath.   Cardiovascular: Negative for chest pain, palpitations and leg swelling.  Gastrointestinal: Negative for abdominal pain, blood in stool, diarrhea and nausea.  Endocrine: Negative for cold intolerance, polydipsia, polyphagia and polyuria.  Genitourinary: Negative for dysuria and hematuria.  Musculoskeletal: Negative for arthralgias.  Skin: Negative for rash.  Neurological: Negative for dizziness and headaches.  Psychiatric/Behavioral: Negative for dysphoric mood and sleep disturbance. The patient is not nervous/anxious.     Allergies Patient has No Known Allergies.  Past Medical History Patient   has a past medical history of Frequent headaches, GERD (gastroesophageal reflux disease), Hyperlipemia, Migraines, and STD (sexually transmitted disease) (02/09/2016).  Surgical History Patient  has a past surgical history that includes No past surgeries.  Family History Pateint's family history includes Alcohol abuse in her maternal grandfather; Asthma in her maternal grandmother; Birth defects in her mother; Depression in her mother; Hypertension in her maternal grandmother; Migraines in her maternal grandfather; Thyroid disease in her mother.  Social History Patient  reports that she has never smoked. She has never used smokeless tobacco. She reports current alcohol use of about 2.0 standard drinks of alcohol per week. She reports that she does not use drugs.    Objective: Vitals:   08/31/20 1336  BP: 118/86  Pulse: 75  Temp: 98.3 F (36.8 C)  TempSrc: Temporal  SpO2: 98%  Weight: 109 lb (49.4 kg)  Height: 4' 11.25" (1.505 m)    Body mass index is 21.83 kg/m.  Physical Exam Vitals reviewed.  Constitutional:      Appearance: Normal appearance. She is well-developed, normal weight and well-nourished.  HENT:     Head: Normocephalic and atraumatic.     Right Ear: Tympanic membrane, ear canal and external ear normal.     Left Ear: Tympanic membrane, ear canal and external ear normal.     Nose: Nose normal.     Mouth/Throat:     Mouth: Oropharynx is clear and moist. Mucous membranes are moist.     Comments: +cobblestoning on posterior pharynx  Eyes:     Extraocular Movements: Extraocular movements intact and EOM normal.     Conjunctiva/sclera: Conjunctivae normal.  Pupils: Pupils are equal, round, and reactive to light.  Neck:     Thyroid: No thyromegaly.  Cardiovascular:     Rate and Rhythm: Normal rate and regular rhythm.     Pulses: Normal pulses and intact distal pulses.     Heart sounds: Normal heart sounds. No murmur heard.   Pulmonary:     Effort: Pulmonary  effort is normal.     Breath sounds: Normal breath sounds.  Abdominal:     General: Abdomen is flat. Bowel sounds are normal. There is no distension.     Palpations: Abdomen is soft.     Tenderness: There is no abdominal tenderness.  Musculoskeletal:     Cervical back: Normal range of motion and neck supple.  Lymphadenopathy:     Cervical: No cervical adenopathy.  Skin:    General: Skin is warm and dry.     Capillary Refill: Capillary refill takes less than 2 seconds.     Findings: No rash.  Neurological:     General: No focal deficit present.     Mental Status: She is alert and oriented to person, place, and time.     Cranial Nerves: No cranial nerve deficit.     Coordination: Coordination normal.     Deep Tendon Reflexes: Reflexes normal.  Psychiatric:        Mood and Affect: Mood and affect and mood normal.        Behavior: Behavior normal.    Flowsheet Row Office Visit from 08/31/2020 in [facility]  PHQ-2 Total Score 0          Assessment/plan: 1. Annual physical exam She had some labs done at her gyn appointment in November, but missing a few. Cholesterol was also starkly elevated compared to prior year. She will return for fasting labs. Otherwise UTD on HM. Continue healthy diet and lifestyle. Doing well overall.  F/u in one year.  Patient counseling [x]    Nutrition: Stressed importance of moderation in sodium/caffeine intake, saturated fat and cholesterol, caloric balance, sufficient intake of fresh fruits, vegetables, fiber, calcium, iron, and 1 mg of folate supplement per day (for females capable of pregnancy).  [x]    Stressed the importance of regular exercise.   [x]    Substance Abuse: Discussed cessation/primary prevention of tobacco, alcohol, or other drug use; driving or other dangerous activities under the influence; availability of treatment for abuse.   [x]    Injury prevention: Discussed safety belts, safety helmets, smoke detector,  smoking near bedding or upholstery.   [x]    Sexuality: Discussed sexually transmitted diseases, partner selection, use of condoms, avoidance of unintended pregnancy  and contraceptive alternatives.  [x]    Dental health: Discussed importance of regular tooth brushing, flossing, and dental visits.  [x]    Health maintenance and immunizations reviewed. Please refer to Health maintenance section.    - CBC with Differential/Platelet; Future - Lipid panel; Future  2. Vitamin D deficiency  - VITAMIN D 25 Hydroxy (Vit-D Deficiency, Fractures); Future  3. Hair loss Likely from covid. Starting to grow back, derm requesting iron panel.  - Iron, TIBC and Ferritin Panel; Future  4. Wheezing -appears to be supraglottic, but she is very anxious about this. Will xray her today.  - DG Chest 2 View; Future    This visit occurred during the SARS-CoV-2 public health emergency.  Safety protocols were in place, including screening questions prior to the visit, additional usage of staff PPE, and extensive cleaning of exam room while observing appropriate  contact time as indicated for disinfecting solutions.     Return in about 1 year (around 08/31/2021) for annual .     Orland Mustard, MD [facility]  08/31/2020

## 2020-09-01 ENCOUNTER — Other Ambulatory Visit: Payer: Self-pay

## 2020-09-01 ENCOUNTER — Other Ambulatory Visit (INDEPENDENT_AMBULATORY_CARE_PROVIDER_SITE_OTHER): Payer: Managed Care, Other (non HMO)

## 2020-09-01 DIAGNOSIS — Z0000 Encounter for general adult medical examination without abnormal findings: Secondary | ICD-10-CM

## 2020-09-01 DIAGNOSIS — E559 Vitamin D deficiency, unspecified: Secondary | ICD-10-CM

## 2020-09-01 DIAGNOSIS — L659 Nonscarring hair loss, unspecified: Secondary | ICD-10-CM

## 2020-09-01 LAB — LIPID PANEL
Cholesterol: 237 mg/dL — ABNORMAL HIGH (ref 0–200)
HDL: 71.6 mg/dL (ref >39.00)
LDL Cholesterol: 152 mg/dL — ABNORMAL HIGH (ref 0–99)
NonHDL: 164.99
Total CHOL/HDL Ratio: 3
Triglycerides: 67 mg/dL (ref 0.0–149.0)
VLDL: 13.4 mg/dL (ref 0.0–40.0)

## 2020-09-01 LAB — CBC WITH DIFFERENTIAL/PLATELET
Basophils Absolute: 0 10*3/uL (ref 0.0–0.1)
Basophils Relative: 0.5 % (ref 0.0–3.0)
Eosinophils Absolute: 0.4 10*3/uL (ref 0.0–0.7)
Eosinophils Relative: 8.2 % — ABNORMAL HIGH (ref 0.0–5.0)
HCT: 37.8 % (ref 36.0–46.0)
Hemoglobin: 12.8 g/dL (ref 12.0–15.0)
Lymphocytes Relative: 44.3 % (ref 12.0–46.0)
Lymphs Abs: 2.3 10*3/uL (ref 0.7–4.0)
MCHC: 33.9 g/dL (ref 30.0–36.0)
MCV: 90.5 fl (ref 78.0–100.0)
Monocytes Absolute: 0.3 10*3/uL (ref 0.1–1.0)
Monocytes Relative: 5.6 % (ref 3.0–12.0)
Neutro Abs: 2.1 10*3/uL (ref 1.4–7.7)
Neutrophils Relative %: 41.4 % — ABNORMAL LOW (ref 43.0–77.0)
Platelets: 295 10*3/uL (ref 150.0–400.0)
RBC: 4.18 Mil/uL (ref 3.87–5.11)
RDW: 13.1 % (ref 11.5–15.5)
WBC: 5.2 10*3/uL (ref 4.0–10.5)

## 2020-09-01 LAB — VITAMIN D 25 HYDROXY (VIT D DEFICIENCY, FRACTURES): VITD: 18.42 ng/mL — ABNORMAL LOW (ref 30.00–100.00)

## 2020-09-02 LAB — IRON,TIBC AND FERRITIN PANEL
%SAT: 35 % (calc) (ref 16–45)
Ferritin: 22 ng/mL (ref 16–154)
Iron: 121 ug/dL (ref 40–190)
TIBC: 344 mcg/dL (calc) (ref 250–450)

## 2020-09-04 ENCOUNTER — Other Ambulatory Visit: Payer: Self-pay | Admitting: Family Medicine

## 2020-09-04 MED ORDER — VITAMIN D (ERGOCALCIFEROL) 1.25 MG (50000 UNIT) PO CAPS: ORAL_CAPSULE | ORAL | 0 refills | Status: DC

## 2020-09-05 ENCOUNTER — Encounter: Payer: Self-pay | Admitting: Family Medicine

## 2020-09-07 ENCOUNTER — Ambulatory Visit: Payer: Managed Care, Other (non HMO) | Admitting: Psychology

## 2020-09-18 ENCOUNTER — Ambulatory Visit (INDEPENDENT_AMBULATORY_CARE_PROVIDER_SITE_OTHER): Payer: 59 | Admitting: Psychology

## 2020-09-18 DIAGNOSIS — F411 Generalized anxiety disorder: Secondary | ICD-10-CM | POA: Diagnosis not present

## 2020-10-25 ENCOUNTER — Ambulatory Visit (INDEPENDENT_AMBULATORY_CARE_PROVIDER_SITE_OTHER): Payer: 59 | Admitting: Psychology

## 2020-10-25 DIAGNOSIS — F411 Generalized anxiety disorder: Secondary | ICD-10-CM | POA: Diagnosis not present

## 2020-10-30 NOTE — Progress Notes (Deleted)
GYNECOLOGY  VISIT  CC:   ***  HPI: 30 y.o. G0P0000 Single Other or two or more races female here for ***.     GYNECOLOGIC HISTORY: No LMP recorded. Contraception: *** Menopausal hormone therapy: ***  Patient Active Problem List   Diagnosis Date Noted  . Migraine without aura and with status migrainosus, not intractable 03/05/2016    Past Medical History:  Diagnosis Date  . Frequent headaches   . GERD (gastroesophageal reflux disease)   . Hyperlipemia   . Migraines    No aura.  . STD (sexually transmitted disease) 02/09/2016   Chlamydia    Past Surgical History:  Procedure Laterality Date  . NO PAST SURGERIES      MEDS:   Current Outpatient Medications on File Prior to Visit  Medication Sig Dispense Refill  . clindamycin (CLINDAGEL) 1 % gel     . ketoconazole (NIZORAL) 2 % shampoo  (Patient not taking: Reported on 08/31/2020)    . meclizine (ANTIVERT) 25 MG tablet Take 1 tablet (25 mg total) by mouth 3 (three) times daily as needed for dizziness. 30 tablet 0  . omeprazole (PRILOSEC) 40 MG capsule as needed. (Patient not taking: No sig reported)    . tretinoin (RETIN-A) 0.1 % cream Apply to face every night    . Vitamin D, Ergocalciferol, (DRISDOL) 1.25 MG (50000 UNIT) CAPS capsule One capsule by mouth once a week for 12 weeks. Then take 2000IU/day 12 capsule 0   No current facility-administered medications on file prior to visit.    ALLERGIES: Patient has no known allergies.  Family History  Problem Relation Age of Onset  . Thyroid disease Mother   . Birth defects Mother   . Depression Mother   . Hypertension Maternal Grandmother   . Asthma Maternal Grandmother   . Migraines Maternal Grandfather   . Alcohol abuse Maternal Grandfather      Review of Systems  PHYSICAL EXAMINATION:    There were no vitals taken for this visit.    General appearance: alert, cooperative, no acute distress  CV:  {Exam; heart brief:31539} Lungs:  {pe lungs  ob:314451::"clear to auscultation, no wheezes, rales or rhonchi, symmetric air entry"} Breasts: {Exam; breast:13139::"normal appearance, no masses or tenderness"} Abdomen: soft, non-tender; no masses,  no organomegaly Lymph:  no inguinal LAD noted  Pelvic: External genitalia:  no lesions              Urethra:  normal appearing urethra with no masses, tenderness or lesions              Bartholins and Skenes: normal                 Vagina: normal appearing vagina               Cervix: {CHL AMB PHY EX CERVIX NORM [phone]::"no lesions"}              Bimanual Exam:  Uterus:  {CHL AMB PHY EX UTERUS NORM DEFAULT:[phone]::"normal size, contour, position, consistency, mobility, non-tender"}              Adnexa: {CHL AMB PHY EX ADNEXA NO MASS DEFAULT:[phone]::"no mass, fullness, tenderness"}               Chaperone, ***, CMA, was present for exam.  Assessment: ***  Plan: ***   {NUMBERS; -10-45 JOINT ROM:10287} minutes of total time was spent for this patient encounter, including preparation, face-to-face counseling with the patient and coordination of care, and documentation  of the encounter.

## 2020-10-31 ENCOUNTER — Ambulatory Visit: Payer: Managed Care, Other (non HMO) | Admitting: Nurse Practitioner

## 2020-10-31 ENCOUNTER — Ambulatory Visit (INDEPENDENT_AMBULATORY_CARE_PROVIDER_SITE_OTHER): Payer: Managed Care, Other (non HMO) | Admitting: Nurse Practitioner

## 2020-10-31 ENCOUNTER — Encounter: Payer: Self-pay | Admitting: Nurse Practitioner

## 2020-10-31 ENCOUNTER — Other Ambulatory Visit: Payer: Self-pay

## 2020-10-31 VITALS — BP 110/76 | HR 68 | Resp 16 | Wt 111.0 lb

## 2020-10-31 DIAGNOSIS — Z113 Encounter for screening for infections with a predominantly sexual mode of transmission: Secondary | ICD-10-CM

## 2020-10-31 DIAGNOSIS — Z114 Encounter for screening for human immunodeficiency virus [HIV]: Secondary | ICD-10-CM

## 2020-10-31 DIAGNOSIS — B3731 Acute candidiasis of vulva and vagina: Secondary | ICD-10-CM

## 2020-10-31 DIAGNOSIS — B373 Candidiasis of vulva and vagina: Secondary | ICD-10-CM | POA: Diagnosis not present

## 2020-10-31 LAB — WET PREP FOR TRICH, YEAST, CLUE

## 2020-10-31 MED ORDER — FLUCONAZOLE 150 MG PO TABS: 150.0000 mg | ORAL_TABLET | Freq: Once | ORAL | 0 refills | Status: AC

## 2020-10-31 NOTE — Progress Notes (Signed)
GYNECOLOGY  VISIT  CC:   Asymptomatic, but old partner notified her that he tested positive for Chlamydia  HPI: 30 y.o. G0P0000 Single Other or two or more races female here for std testing.  Period Duration (Days): 2-3 Period Pattern: Regular Menstrual Flow: Light Menstrual Control: Tampon Dysmenorrhea: None  GYNECOLOGIC HISTORY: Patient's last menstrual period was 10/22/2020 (exact date). Contraception: abstinence Menopausal hormone therapy: none  Patient Active Problem List   Diagnosis Date Noted  . Migraine without aura and with status migrainosus, not intractable 03/05/2016    Past Medical History:  Diagnosis Date  . Frequent headaches   . GERD (gastroesophageal reflux disease)   . Hyperlipemia   . Migraines    No aura.  . STD (sexually transmitted disease) 02/09/2016   Chlamydia    Past Surgical History:  Procedure Laterality Date  . NO PAST SURGERIES      MEDS:   Current Outpatient Medications on File Prior to Visit  Medication Sig Dispense Refill  . clindamycin (CLINDAGEL) 1 % gel     . tretinoin (RETIN-A) 0.1 % cream Apply to face every night    . Vitamin D, Ergocalciferol, (DRISDOL) 1.25 MG (50000 UNIT) CAPS capsule One capsule by mouth once a week for 12 weeks. Then take 2000IU/day 12 capsule 0   No current facility-administered medications on file prior to visit.    ALLERGIES: Patient has no known allergies.  Family History  Problem Relation Age of Onset  . Thyroid disease Mother   . Birth defects Mother   . Depression Mother   . Hypertension Maternal Grandmother   . Asthma Maternal Grandmother   . Migraines Maternal Grandfather   . Alcohol abuse Maternal Grandfather      Review of Systems  Constitutional: Negative.   HENT: Negative.   Eyes: Negative.   Respiratory: Negative.   Cardiovascular: Negative.   Gastrointestinal: Negative.   Endocrine: Negative.   Genitourinary: Negative.   Musculoskeletal: Negative.   Skin: Negative.    Allergic/Immunologic: Negative.   Neurological: Negative.   Hematological: Negative.   Psychiatric/Behavioral: Negative.     PHYSICAL EXAMINATION:    BP 110/76   Pulse 68   Resp 16   Wt 111 lb (50.3 kg)   LMP 10/22/2020 (Exact Date)   BMI 22.23 kg/m     General appearance: alert, cooperative, no acute distress  Lymph:  no inguinal LAD noted  Pelvic: External genitalia:  no lesions              Urethra:  normal appearing urethra with no masses, tenderness or lesions              Bartholins and Skenes: normal                 Vagina: normal appearing vagina , normal appearing discharge, ph 4.0              Cervix: no cervical motion tenderness and no lesions              Bimanual Exam:  Uterus:  normal size, contour, position, consistency, mobility, non-tender              Adnexa: no mass, fullness, tenderness               Chaperone, Kim G, CMA, was present for exam.  Assessment/Plan: Screen for STD (sexually transmitted disease) - Plan: C. trachomatis/N. gonorrhoeae RNA, RPR, WET PREP FOR TRICH, YEAST, CLUE  Screening for HIV (human immunodeficiency virus) -  Plan: HIV antibody (with reflex)  Vaginal candida - Plan: fluconazole (DIFLUCAN) 150 MG tablet  Advised results will come to MyChart, but will be called if any thing abnormal

## 2020-10-31 NOTE — Patient Instructions (Signed)
Vaginal Yeast Infection, Adult  Vaginal yeast infection is a condition that causes vaginal discharge as well as soreness, swelling, and redness (inflammation) of the vagina. This is a common condition. Some women get this infection frequently. What are the causes? This condition is caused by a change in the normal balance of the yeast (candida) and bacteria that live in the vagina. This change causes an overgrowth of yeast, which causes the inflammation. What increases the risk? The condition is more likely to develop in women who:  Take antibiotic medicines.  Have diabetes.  Take birth control pills.  Are pregnant.  Douche often.  Have a weak body defense system (immune system).  Have been taking steroid medicines for a long time.  Frequently wear tight clothing. What are the signs or symptoms? Symptoms of this condition include:  White, thick, creamy vaginal discharge.  Swelling, itching, redness, and irritation of the vagina. The lips of the vagina (vulva) may be affected as well.  Pain or a burning feeling while urinating.  Pain during sex. How is this diagnosed? This condition is diagnosed based on:  Your medical history.  A physical exam.  A pelvic exam. Your health care provider will examine a sample of your vaginal discharge under a microscope. Your health care provider may send this sample for testing to confirm the diagnosis. How is this treated? This condition is treated with medicine. Medicines may be over-the-counter or prescription. You may be told to use one or more of the following:  Medicine that is taken by mouth (orally).  Medicine that is applied as a cream (topically).  Medicine that is inserted directly into the vagina (suppository). Follow these instructions at home: Lifestyle  Do not have sex until your health care provider approves. Tell your sex partner that you have a yeast infection. That person should go to his or her health care  provider and ask if they should also be treated.  Do not wear tight clothes, such as pantyhose or tight pants.  Wear breathable cotton underwear. General instructions  Take or apply over-the-counter and prescription medicines only as told by your health care provider.  Eat more yogurt. This may help to keep your yeast infection from returning.  Do not use tampons until your health care provider approves.  Try taking a sitz bath to help with discomfort. This is a warm water bath that is taken while you are sitting down. The water should only come up to your hips and should cover your buttocks. Do this 3-4 times per day or as told by your health care provider.  Do not douche.  If you have diabetes, keep your blood sugar levels under control.  Keep all follow-up visits as told by your health care provider. This is important.   Contact a health care provider if:  You have a fever.  Your symptoms go away and then return.  Your symptoms do not get better with treatment.  Your symptoms get worse.  You have new symptoms.  You develop blisters in or around your vagina.  You have blood coming from your vagina and it is not your menstrual period.  You develop pain in your abdomen. Summary  Vaginal yeast infection is a condition that causes discharge as well as soreness, swelling, and redness (inflammation) of the vagina.  This condition is treated with medicine. Medicines may be over-the-counter or prescription.  Take or apply over-the-counter and prescription medicines only as told by your health care provider.  Do not   douche. Do not have sex or use tampons until your health care provider approves.  Contact a health care provider if your symptoms do not get better with treatment or your symptoms go away and then return. This information is not intended to replace advice given to you by your health care provider. Make sure you discuss any questions you have with your health care  provider. Document Revised: 01/22/2019 Document Reviewed: 11/10/2017 Elsevier Patient Education  2021 Elsevier Inc.  

## 2020-11-01 LAB — C. TRACHOMATIS/N. GONORRHOEAE RNA
C. trachomatis RNA, TMA: NOT DETECTED
N. gonorrhoeae RNA, TMA: NOT DETECTED

## 2020-11-01 LAB — HIV ANTIBODY (ROUTINE TESTING W REFLEX): HIV 1&2 Ab, 4th Generation: NONREACTIVE

## 2020-11-01 LAB — RPR: RPR Ser Ql: NONREACTIVE

## 2020-11-21 ENCOUNTER — Other Ambulatory Visit: Payer: Self-pay | Admitting: Family Medicine

## 2020-11-29 ENCOUNTER — Ambulatory Visit (INDEPENDENT_AMBULATORY_CARE_PROVIDER_SITE_OTHER): Payer: 59 | Admitting: Psychology

## 2020-11-29 DIAGNOSIS — F411 Generalized anxiety disorder: Secondary | ICD-10-CM | POA: Diagnosis not present

## 2021-05-21 ENCOUNTER — Ambulatory Visit: Payer: Managed Care, Other (non HMO) | Admitting: Obstetrics and Gynecology

## 2021-05-21 NOTE — Progress Notes (Deleted)
30 y.o. G0P0000 Single Hispanic female here for annual exam.    PCP:     No LMP recorded.           Sexually active: {yes no:314532}  The current method of family planning is ***condoms {:310972}.    Exercising: {yes no:314532}  {types:19826} Smoker:  no  Health Maintenance: Pap:  03/26/18 Neg,  02/25/17 Neg, 03-02-14 Neg History of abnormal Pap:  no MMG: n/a Colonoscopy:  n/a BMD:   n/a  Result  n/a TDaP:  2014 Gardasil:   yes HIV:01-30-20 NR Hep C:01-30-20 Neg Screening Labs:  Hb today: ***, Urine today: ***   reports that she has never smoked. She has never used smokeless tobacco. She reports current alcohol use of about 2.0 standard drinks per week. She reports that she does not use drugs.  Past Medical History:  Diagnosis Date   Frequent headaches    GERD (gastroesophageal reflux disease)    Hyperlipemia    Migraines    No aura.   STD (sexually transmitted disease) 02/09/2016   Chlamydia    Past Surgical History:  Procedure Laterality Date   NO PAST SURGERIES      Current Outpatient Medications  Medication Sig Dispense Refill   clindamycin (CLINDAGEL) 1 % gel      tretinoin (RETIN-A) 0.1 % cream Apply to face every night     Vitamin D, Ergocalciferol, (DRISDOL) 1.25 MG (50000 UNIT) CAPS capsule One capsule by mouth once a week for 12 weeks. Then take 2000IU/day 12 capsule 0   No current facility-administered medications for this visit.    Family History  Problem Relation Age of Onset   Thyroid disease Mother    Birth defects Mother    Depression Mother    Hypertension Maternal Grandmother    Asthma Maternal Grandmother    Migraines Maternal Grandfather    Alcohol abuse Maternal Grandfather     Review of Systems  Exam:   There were no vitals taken for this visit.    General appearance: alert, cooperative and appears stated age Head: normocephalic, without obvious abnormality, atraumatic Neck: no adenopathy, supple, symmetrical, trachea midline and  thyroid normal to inspection and palpation Lungs: clear to auscultation bilaterally Breasts: normal appearance, no masses or tenderness, No nipple retraction or dimpling, No nipple discharge or bleeding, No axillary adenopathy Heart: regular rate and rhythm Abdomen: soft, non-tender; no masses, no organomegaly Extremities: extremities normal, atraumatic, no cyanosis or edema Skin: skin color, texture, turgor normal. No rashes or lesions Lymph nodes: cervical, supraclavicular, and axillary nodes normal. Neurologic: grossly normal  Pelvic: External genitalia:  no lesions              No abnormal inguinal nodes palpated.              Urethra:  normal appearing urethra with no masses, tenderness or lesions              Bartholins and Skenes: normal                 Vagina: normal appearing vagina with normal color and discharge, no lesions              Cervix: no lesions              Pap taken: {yes no:314532} Bimanual Exam:  Uterus:  normal size, contour, position, consistency, mobility, non-tender              Adnexa: no mass, fullness, tenderness  Rectal exam: {yes no:314532}.  Confirms.              Anus:  normal sphincter tone, no lesions  Chaperone was present for exam:  ***  Assessment:   Well woman visit with gynecologic exam.   Plan: Mammogram screening discussed. Self breast awareness reviewed. Pap and HR HPV as above. Guidelines for Calcium, Vitamin D, regular exercise program including cardiovascular and weight bearing exercise.   Follow up annually and prn.   Additional counseling given.  {yes [id number]. _______ minutes face to face time of which over 50% was spent in counseling.    After visit summary provided.

## 2021-06-21 NOTE — Progress Notes (Signed)
30 y.o. G0P0000 Single Hispanic female here for annual exam.    Working from home.   New partner.   Female partner.  Last intercourse was one month ago.   Wants STD testing.  She wants to try to delay her menstruation this weekend because she is traveling.  She did well on name brand NuvaRing, but the generic caused mood swings.   PCP:   Orland Mustard  Patient's last menstrual period was 06/09/2021 (exact date).     Period Duration (Days): 2-3 Period Pattern: Regular Menstrual Flow: Light Menstrual Control: Tampon Dysmenorrhea: None     Sexually active: Yes.    The current method of family planning is condoms sometimes.    Exercising: Yes.     Spin class, weights Smoker:  no  Health Maintenance: Pap:  03-26-18 neg, 02-25-17 neg, 03-02-14 neg History of abnormal Pap:  no MMG:  none Colonoscopy:  none BMD:   none  Result  n/a TDaP:  2014 Gardasil:   yes, completed series HIV: neg 10-31-20 Hep C: neg 05-16-20 Screening Labs:  PCP.  Flu vaccine:  Declines.    reports that she has never smoked. She has never used smokeless tobacco. She reports current alcohol use of about 2.0 standard drinks per week. She reports that she does not use drugs.  Past Medical History:  Diagnosis Date   Frequent headaches    GERD (gastroesophageal reflux disease)    Hyperlipemia    Migraines    No aura.   STD (sexually transmitted disease) 02/09/2016   Chlamydia    Past Surgical History:  Procedure Laterality Date   NO PAST SURGERIES      Current Outpatient Medications  Medication Sig Dispense Refill   clindamycin (CLINDAGEL) 1 % gel      tretinoin (RETIN-A) 0.1 % cream Apply to face every night     No current facility-administered medications for this visit.    Family History  Problem Relation Age of Onset   Thyroid disease Mother    Birth defects Mother    Depression Mother    Hypertension Maternal Grandmother    Asthma Maternal Grandmother    Migraines Maternal Grandfather     Alcohol abuse Maternal Grandfather     Review of Systems  Constitutional: Negative.   HENT: Negative.    Eyes: Negative.   Respiratory: Negative.    Cardiovascular: Negative.   Gastrointestinal: Negative.   Endocrine: Negative.   Genitourinary: Negative.   Musculoskeletal: Negative.   Skin: Negative.   Allergic/Immunologic: Negative.   Neurological: Negative.   Hematological: Negative.   Psychiatric/Behavioral: Negative.     Exam:   BP 106/72    Pulse 68    Resp 16    Ht 4' 11.25" (1.505 m)    Wt 110 lb (49.9 kg)    LMP 06/09/2021 (Exact Date)    BMI 22.03 kg/m     General appearance: alert, cooperative and appears stated age Head: normocephalic, without obvious abnormality, atraumatic Neck: no adenopathy, supple, symmetrical, trachea midline and thyroid normal to inspection and palpation Lungs: clear to auscultation bilaterally Breasts: normal appearance, no masses or tenderness, No nipple retraction or dimpling, No nipple discharge or bleeding, No axillary adenopathy Heart: regular rate and rhythm Abdomen: soft, non-tender; no masses, no organomegaly Extremities: extremities normal, atraumatic, no cyanosis or edema Skin: skin color, texture, turgor normal. No rashes or lesions Lymph nodes: cervical, supraclavicular, and axillary nodes normal. Neurologic: grossly normal  Pelvic: External genitalia:  no lesions  No abnormal inguinal nodes palpated.              Urethra:  normal appearing urethra with no masses, tenderness or lesions              Bartholins and Skenes: normal                 Vagina: normal appearing vagina with normal color and discharge, no lesions              Cervix: no lesions              Pap taken: yes Bimanual Exam:  Uterus:  normal size, contour, position, consistency, mobility, non-tender              Adnexa: no mass, fullness, tenderness               Chaperone was present for exam:  Joy, CMA.   Assessment:   Well woman visit  with gynecologic exam. Hx chlamydia.  Migraine with no aura. Elevated cholesterol.  Plan: Mammogram screening age 56 yo.  Self breast awareness reviewed. Pap and reflex HR HPV collected.  Guidelines for Calcium, Vitamin D, regular exercise program including cardiovascular and weight bearing exercise. Start NuvaRing, name brand.  STD screening.   Follow up annually and prn.   After visit summary provided.

## 2021-07-02 IMAGING — DX DG CHEST 2V
2 series · 2 of 2 positions shown · non-contrast
Comparison: None.

CLINICAL DATA: Wheezing with cough

EXAM:
CHEST - 2 VIEW

[chest pa]
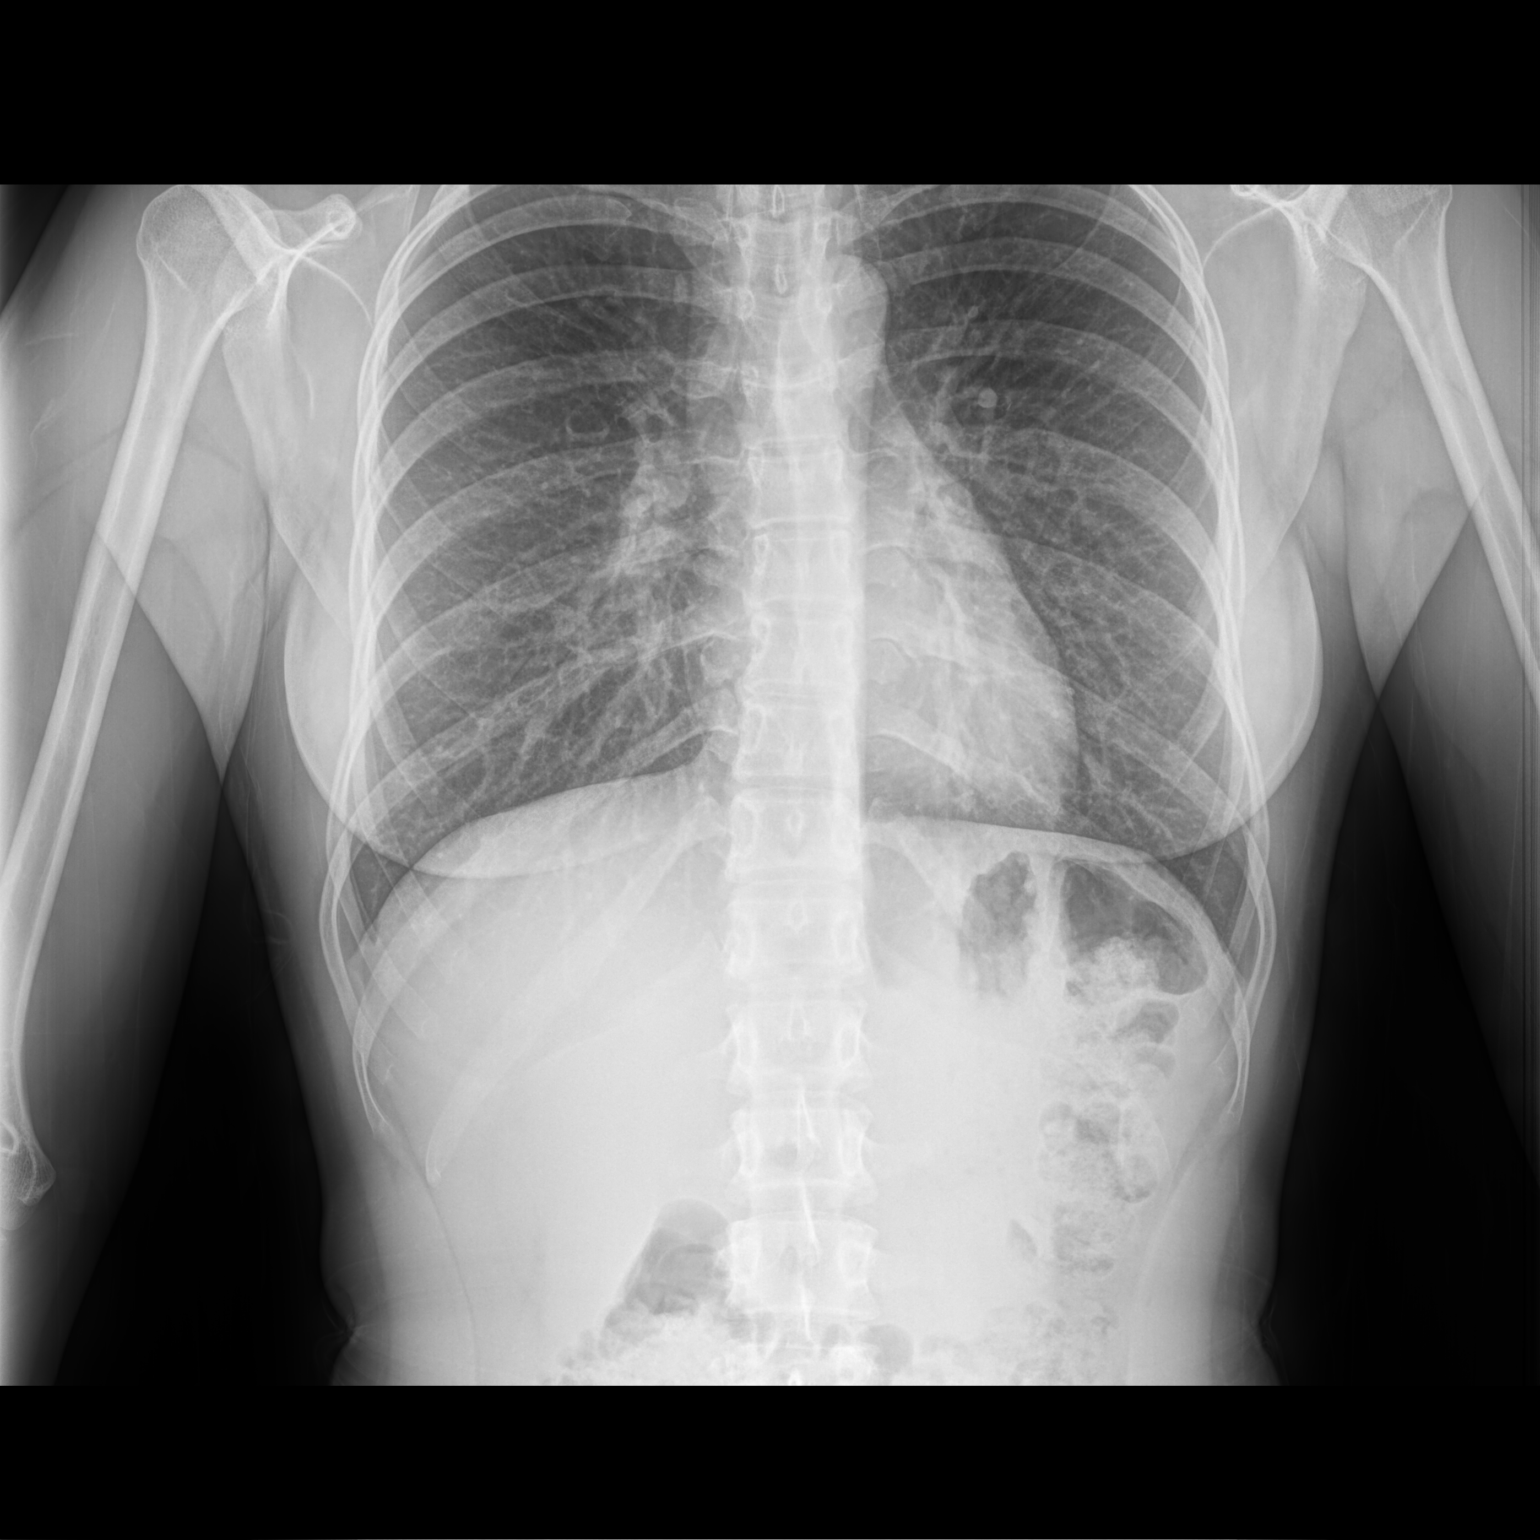

[chest lat]
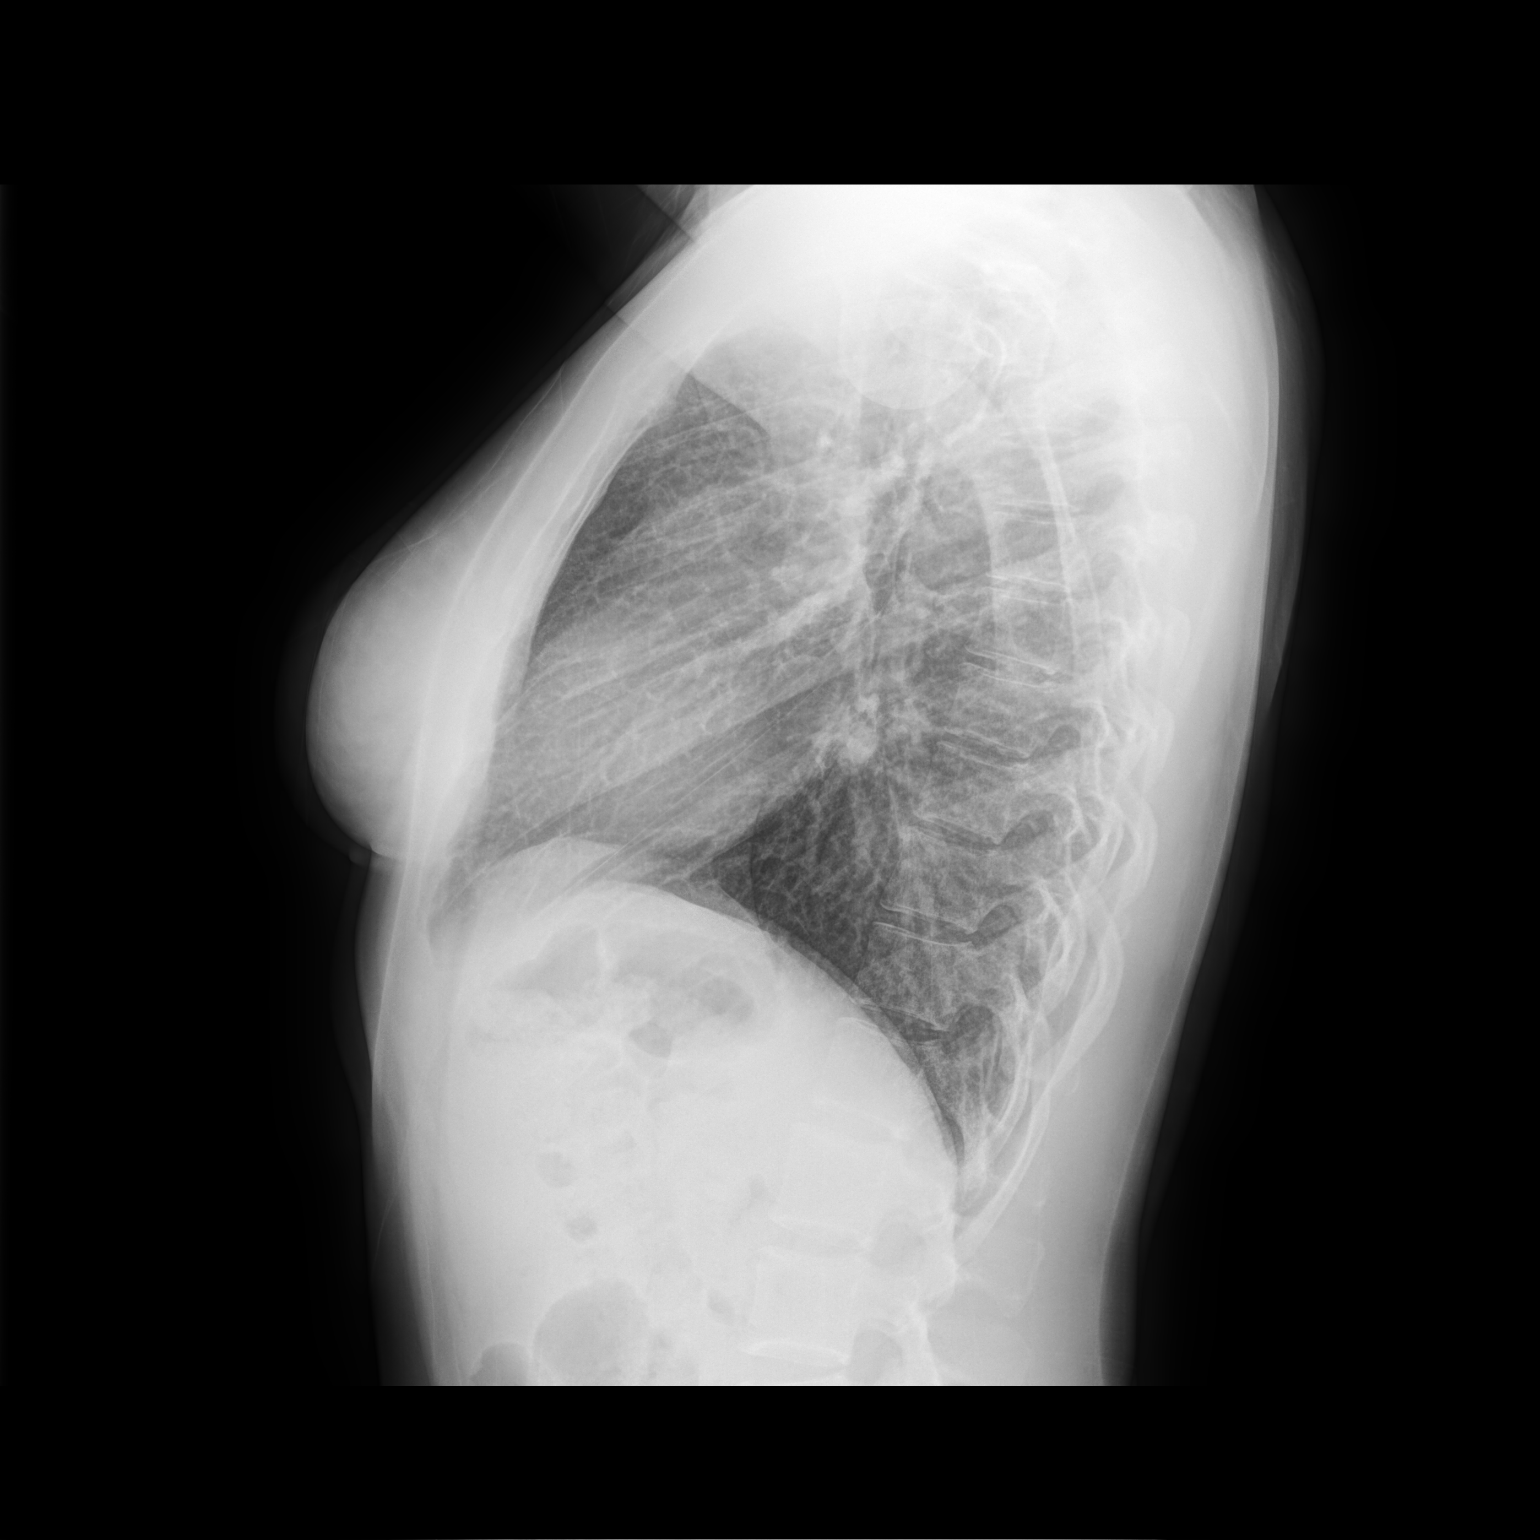

[2 of 2 positions shown; findings below may reference images not displayed]

FINDINGS: Mild interstitial thickening with scattered areas of bronchial wall
thickening as can be seen with bronchitis. No focal consolidation.
No pleural effusion or pneumothorax. Heart and mediastinal contours
are unremarkable.

No acute osseous abnormality.
IMPRESSION: Mild interstitial thickening with scattered areas of bronchial wall
thickening as can be seen with bronchitis.

## 2021-07-03 ENCOUNTER — Ambulatory Visit (INDEPENDENT_AMBULATORY_CARE_PROVIDER_SITE_OTHER): Payer: Managed Care, Other (non HMO) | Admitting: Obstetrics and Gynecology

## 2021-07-03 ENCOUNTER — Other Ambulatory Visit (HOSPITAL_COMMUNITY)
Admission: RE | Admit: 2021-07-03 | Discharge: 2021-07-03 | Disposition: A | Discharge status: Home / Self Care | Payer: Managed Care, Other (non HMO) | Source: Ambulatory Visit | Attending: Obstetrics and Gynecology | Admitting: Obstetrics and Gynecology

## 2021-07-03 ENCOUNTER — Encounter: Payer: Self-pay | Admitting: Obstetrics and Gynecology

## 2021-07-03 ENCOUNTER — Other Ambulatory Visit: Payer: Self-pay

## 2021-07-03 VITALS — BP 106/72 | HR 68 | Resp 16 | Ht 59.25 in | Wt 110.0 lb

## 2021-07-03 DIAGNOSIS — Z113 Encounter for screening for infections with a predominantly sexual mode of transmission: Secondary | ICD-10-CM | POA: Diagnosis present

## 2021-07-03 DIAGNOSIS — Z01419 Encounter for gynecological examination (general) (routine) without abnormal findings: Secondary | ICD-10-CM | POA: Insufficient documentation

## 2021-07-03 MED ORDER — ETONOGESTREL-ETHINYL ESTRADIOL 0.12-0.015 MG/24HR VA RING: VAGINAL_RING | VAGINAL | 12 refills | Status: AC

## 2021-07-03 NOTE — Patient Instructions (Signed)

## 2021-07-04 LAB — RPR: RPR Ser Ql: NONREACTIVE

## 2021-07-04 LAB — HEPATITIS C ANTIBODY
Hepatitis C Ab: NONREACTIVE
SIGNAL TO CUT-OFF: 0.07 (ref <1.00)

## 2021-07-04 LAB — HIV ANTIBODY (ROUTINE TESTING W REFLEX): HIV 1&2 Ab, 4th Generation: NONREACTIVE

## 2021-07-05 LAB — CYTOLOGY - PAP
Chlamydia: NEGATIVE
Comment: NEGATIVE
Comment: NEGATIVE
Comment: NORMAL
Diagnosis: NEGATIVE
Neisseria Gonorrhea: NEGATIVE
Trichomonas: NEGATIVE

## 2021-08-21 ENCOUNTER — Ambulatory Visit: Payer: Managed Care, Other (non HMO) | Admitting: Obstetrics and Gynecology
